# Patient Record
Sex: Female | Born: 1985 | Race: White | Hispanic: No | Marital: Married | Smoking: Former smoker
Health system: Southern US, Community
[De-identification: ages and names within clinical notes are randomized; demographics above are authoritative.]

## PROBLEM LIST (undated history)

## (undated) DIAGNOSIS — F419 Anxiety disorder, unspecified: Secondary | ICD-10-CM

## (undated) DIAGNOSIS — E282 Polycystic ovarian syndrome: Secondary | ICD-10-CM

## (undated) DIAGNOSIS — E039 Hypothyroidism, unspecified: Secondary | ICD-10-CM

## (undated) HISTORY — PX: WISDOM TOOTH EXTRACTION: SHX21

## (undated) HISTORY — PX: LASER ABLATION OF THE CERVIX: SHX1949

---

## 2015-10-03 NOTE — L&D Delivery Note (Signed)
Delivery Note Pt progressed rapidly to complete dilation and began  to have variable decels again so was instructed on pushing and did great pushing less than 30 minutes.   At 2:26 PM a healthy female was delivered via Vaginal, Spontaneous Delivery (Presentation: LOA  ).  APGAR: 9, 9; weight pending.   Placenta status: delivered spontaneously .  Cord:  with the following complications:tight nuchal x 2 delivered through.   Anesthesia:  epidural Episiotomy:  none Lacerations:  Abrasions repaired for hemostasis Suture Repair: 3.0 vicryl rapide Est. Blood Loss (mL): 125ml  Mom to postpartum.  Baby to Couplet care / Skin to Skin.  Will monitor pt BP carefully given mild preeclampsia.   D/w pt circumcision in detail and she desires in hospital and has paid at office.  Oliver PilaRICHARDSON,Arling Cerone W 08/21/2016, 2:48 PM

## 2016-02-02 LAB — OB RESULTS CONSOLE ABO/RH: RH TYPE: POSITIVE

## 2016-02-02 LAB — OB RESULTS CONSOLE GC/CHLAMYDIA
Chlamydia: NEGATIVE
Gonorrhea: NEGATIVE

## 2016-02-02 LAB — OB RESULTS CONSOLE RPR: RPR: NONREACTIVE

## 2016-02-02 LAB — OB RESULTS CONSOLE HEPATITIS B SURFACE ANTIGEN: HEP B S AG: NEGATIVE

## 2016-02-02 LAB — OB RESULTS CONSOLE RUBELLA ANTIBODY, IGM: RUBELLA: IMMUNE

## 2016-02-02 LAB — OB RESULTS CONSOLE GBS
GBS: NEGATIVE
GBS: POSITIVE
GBS: POSITIVE

## 2016-02-02 LAB — OB RESULTS CONSOLE ANTIBODY SCREEN: ANTIBODY SCREEN: NEGATIVE

## 2016-02-02 LAB — OB RESULTS CONSOLE HIV ANTIBODY (ROUTINE TESTING): HIV: NONREACTIVE

## 2016-07-28 LAB — OB RESULTS CONSOLE GBS: GBS: POSITIVE

## 2016-08-21 ENCOUNTER — Inpatient Hospital Stay (HOSPITAL_COMMUNITY): Payer: BLUE CROSS/BLUE SHIELD | Admitting: Anesthesiology

## 2016-08-21 ENCOUNTER — Inpatient Hospital Stay (HOSPITAL_COMMUNITY)
Admission: AD | Admit: 2016-08-21 | Discharge: 2016-08-23 | DRG: 775 | Disposition: A | Discharge status: Home / Self Care | Payer: BLUE CROSS/BLUE SHIELD | Source: Ambulatory Visit | Attending: Obstetrics and Gynecology | Admitting: Obstetrics and Gynecology

## 2016-08-21 ENCOUNTER — Encounter (HOSPITAL_COMMUNITY): Payer: Self-pay | Admitting: *Deleted

## 2016-08-21 DIAGNOSIS — O139 Gestational [pregnancy-induced] hypertension without significant proteinuria, unspecified trimester: Secondary | ICD-10-CM | POA: Diagnosis present

## 2016-08-21 DIAGNOSIS — O99214 Obesity complicating childbirth: Secondary | ICD-10-CM | POA: Diagnosis present

## 2016-08-21 DIAGNOSIS — O1403 Mild to moderate pre-eclampsia, third trimester: Secondary | ICD-10-CM

## 2016-08-21 DIAGNOSIS — Z3A38 38 weeks gestation of pregnancy: Secondary | ICD-10-CM

## 2016-08-21 DIAGNOSIS — O99284 Endocrine, nutritional and metabolic diseases complicating childbirth: Secondary | ICD-10-CM | POA: Diagnosis present

## 2016-08-21 DIAGNOSIS — O99824 Streptococcus B carrier state complicating childbirth: Secondary | ICD-10-CM | POA: Diagnosis present

## 2016-08-21 DIAGNOSIS — G932 Benign intracranial hypertension: Secondary | ICD-10-CM | POA: Diagnosis present

## 2016-08-21 DIAGNOSIS — Z6841 Body Mass Index (BMI) 40.0 and over, adult: Secondary | ICD-10-CM

## 2016-08-21 DIAGNOSIS — R030 Elevated blood-pressure reading, without diagnosis of hypertension: Secondary | ICD-10-CM | POA: Diagnosis present

## 2016-08-21 DIAGNOSIS — E039 Hypothyroidism, unspecified: Secondary | ICD-10-CM | POA: Diagnosis present

## 2016-08-21 DIAGNOSIS — O1404 Mild to moderate pre-eclampsia, complicating childbirth: Secondary | ICD-10-CM | POA: Diagnosis present

## 2016-08-21 HISTORY — DX: Hypothyroidism, unspecified: E03.9

## 2016-08-21 LAB — COMPREHENSIVE METABOLIC PANEL
ALT: 18 U/L (ref 14–54)
AST: 18 U/L (ref 15–41)
Albumin: 2.9 g/dL — ABNORMAL LOW (ref 3.5–5.0)
Alkaline Phosphatase: 144 U/L — ABNORMAL HIGH (ref 38–126)
Anion gap: 8 (ref 5–15)
BUN: 7 mg/dL (ref 6–20)
CHLORIDE: 106 mmol/L (ref 101–111)
CO2: 21 mmol/L — AB (ref 22–32)
CREATININE: 0.63 mg/dL (ref 0.44–1.00)
Calcium: 9.4 mg/dL (ref 8.9–10.3)
GFR calc Af Amer: >60 mL/min (ref >60)
GFR calc non Af Amer: >60 mL/min (ref >60)
Glucose, Bld: 101 mg/dL — ABNORMAL HIGH (ref 65–99)
Potassium: 4.4 mmol/L (ref 3.5–5.1)
Sodium: 135 mmol/L (ref 135–145)
Total Bilirubin: 0.4 mg/dL (ref 0.3–1.2)
Total Protein: 6.6 g/dL (ref 6.5–8.1)

## 2016-08-21 LAB — CBC
HEMATOCRIT: 37.3 % (ref 36.0–46.0)
Hemoglobin: 12.7 g/dL (ref 12.0–15.0)
MCH: 30.2 pg (ref 26.0–34.0)
MCHC: 34 g/dL (ref 30.0–36.0)
MCV: 88.8 fL (ref 78.0–100.0)
Platelets: 430 10*3/uL — ABNORMAL HIGH (ref 150–400)
RBC: 4.2 MIL/uL (ref 3.87–5.11)
RDW: 14.6 % (ref 11.5–15.5)
WBC: 14 10*3/uL — AB (ref 4.0–10.5)

## 2016-08-21 LAB — TYPE AND SCREEN
ABO/RH(D): AB POS
Antibody Screen: NEGATIVE

## 2016-08-21 LAB — ABO/RH: ABO/RH(D): AB POS

## 2016-08-21 LAB — PROTEIN / CREATININE RATIO, URINE
Creatinine, Urine: 62 mg/dL
Protein Creatinine Ratio: 0.92 mg/mg{Cre} — ABNORMAL HIGH (ref 0.00–0.15)
Total Protein, Urine: 57 mg/dL

## 2016-08-21 MED ORDER — OXYTOCIN BOLUS FROM INFUSION
500.0000 mL | Freq: Once | INTRAVENOUS | Status: AC
  Administered 2016-08-21: 500 mL via INTRAVENOUS

## 2016-08-21 MED ORDER — FENTANYL 2.5 MCG/ML BUPIVACAINE 1/10 % EPIDURAL INFUSION (WH - ANES)
14.0000 mL/h | INTRAMUSCULAR | Status: DC | PRN
  Administered 2016-08-21 (×2): 14 mL/h via EPIDURAL

## 2016-08-21 MED ORDER — TERBUTALINE SULFATE 1 MG/ML IJ SOLN
0.2500 mg | Freq: Once | INTRAMUSCULAR | Status: DC | PRN
  Filled 2016-08-21: qty 1

## 2016-08-21 MED ORDER — OXYCODONE HCL 5 MG PO TABS: 5.0000 mg | ORAL_TABLET | ORAL | Status: DC | PRN

## 2016-08-21 MED ORDER — PRENATAL MULTIVITAMIN CH
1.0000 | ORAL_TABLET | Freq: Every day | ORAL | Status: DC
  Administered 2016-08-22 – 2016-08-23 (×2): 1 via ORAL
  Filled 2016-08-21 (×2): qty 1

## 2016-08-21 MED ORDER — SOD CITRATE-CITRIC ACID 500-334 MG/5ML PO SOLN: 30.0000 mL | ORAL | Status: DC | PRN

## 2016-08-21 MED ORDER — TETANUS-DIPHTH-ACELL PERTUSSIS 5-2.5-18.5 LF-MCG/0.5 IM SUSP: 0.5000 mL | Freq: Once | INTRAMUSCULAR | Status: DC

## 2016-08-21 MED ORDER — ONDANSETRON HCL 4 MG PO TABS: 4.0000 mg | ORAL_TABLET | ORAL | Status: DC | PRN

## 2016-08-21 MED ORDER — ONDANSETRON HCL 4 MG/2ML IJ SOLN: 4.0000 mg | INTRAMUSCULAR | Status: DC | PRN

## 2016-08-21 MED ORDER — OXYTOCIN 40 UNITS IN LACTATED RINGERS INFUSION - SIMPLE MED
1.0000 m[IU]/min | INTRAVENOUS | Status: DC
  Administered 2016-08-21: 2 m[IU]/min via INTRAVENOUS
  Filled 2016-08-21: qty 1000

## 2016-08-21 MED ORDER — LIDOCAINE HCL (PF) 1 % IJ SOLN
30.0000 mL | INTRAMUSCULAR | Status: DC | PRN
  Filled 2016-08-21: qty 30

## 2016-08-21 MED ORDER — SIMETHICONE 80 MG PO CHEW: 80.0000 mg | CHEWABLE_TABLET | ORAL | Status: DC | PRN

## 2016-08-21 MED ORDER — SENNOSIDES-DOCUSATE SODIUM 8.6-50 MG PO TABS
2.0000 | ORAL_TABLET | ORAL | Status: DC
  Administered 2016-08-22 – 2016-08-23 (×2): 2 via ORAL
  Filled 2016-08-21 (×3): qty 2

## 2016-08-21 MED ORDER — PENICILLIN G POTASSIUM 5000000 UNITS IJ SOLR
5.0000 10*6.[IU] | Freq: Once | INTRAVENOUS | Status: AC
  Administered 2016-08-21: 5 10*6.[IU] via INTRAVENOUS
  Filled 2016-08-21: qty 5

## 2016-08-21 MED ORDER — COCONUT OIL OIL
1.0000 "application " | TOPICAL_OIL | Status: DC | PRN
  Administered 2016-08-22: 1 via TOPICAL
  Filled 2016-08-21: qty 120

## 2016-08-21 MED ORDER — DIBUCAINE 1 % RE OINT: 1.0000 "application " | TOPICAL_OINTMENT | RECTAL | Status: DC | PRN

## 2016-08-21 MED ORDER — LIDOCAINE HCL (PF) 1 % IJ SOLN
INTRAMUSCULAR | Status: DC | PRN
  Administered 2016-08-21 (×2): 7 mL via EPIDURAL

## 2016-08-21 MED ORDER — OXYCODONE-ACETAMINOPHEN 5-325 MG PO TABS: 2.0000 | ORAL_TABLET | ORAL | Status: DC | PRN

## 2016-08-21 MED ORDER — PHENYLEPHRINE 40 MCG/ML (10ML) SYRINGE FOR IV PUSH (FOR BLOOD PRESSURE SUPPORT)
80.0000 ug | PREFILLED_SYRINGE | INTRAVENOUS | Status: DC | PRN
  Filled 2016-08-21: qty 5

## 2016-08-21 MED ORDER — LACTATED RINGERS IV SOLN
INTRAVENOUS | Status: DC
  Administered 2016-08-21: 125 mL via INTRAVENOUS

## 2016-08-21 MED ORDER — FLEET ENEMA 7-19 GM/118ML RE ENEM: 1.0000 | ENEMA | RECTAL | Status: DC | PRN

## 2016-08-21 MED ORDER — WITCH HAZEL-GLYCERIN EX PADS: 1.0000 "application " | MEDICATED_PAD | CUTANEOUS | Status: DC | PRN

## 2016-08-21 MED ORDER — OXYTOCIN 40 UNITS IN LACTATED RINGERS INFUSION - SIMPLE MED: 2.5000 [IU]/h | INTRAVENOUS | Status: DC

## 2016-08-21 MED ORDER — ZOLPIDEM TARTRATE 5 MG PO TABS: 5.0000 mg | ORAL_TABLET | Freq: Every evening | ORAL | Status: DC | PRN

## 2016-08-21 MED ORDER — ACETAMINOPHEN 325 MG PO TABS: 650.0000 mg | ORAL_TABLET | ORAL | Status: DC | PRN

## 2016-08-21 MED ORDER — PENICILLIN G POT IN DEXTROSE 60000 UNIT/ML IV SOLN
3.0000 10*6.[IU] | INTRAVENOUS | Status: DC
  Filled 2016-08-21 (×3): qty 50

## 2016-08-21 MED ORDER — PHENYLEPHRINE 40 MCG/ML (10ML) SYRINGE FOR IV PUSH (FOR BLOOD PRESSURE SUPPORT)
PREFILLED_SYRINGE | INTRAVENOUS | Status: AC
  Filled 2016-08-21: qty 20

## 2016-08-21 MED ORDER — ACETAMINOPHEN 325 MG PO TABS
650.0000 mg | ORAL_TABLET | ORAL | Status: DC | PRN
  Administered 2016-08-21 – 2016-08-23 (×7): 650 mg via ORAL
  Filled 2016-08-21 (×7): qty 2

## 2016-08-21 MED ORDER — LACTATED RINGERS IV SOLN: 500.0000 mL | Freq: Once | INTRAVENOUS | Status: DC

## 2016-08-21 MED ORDER — BENZOCAINE-MENTHOL 20-0.5 % EX AERO
1.0000 "application " | INHALATION_SPRAY | CUTANEOUS | Status: DC | PRN
  Administered 2016-08-21: 1 via TOPICAL
  Filled 2016-08-21: qty 56

## 2016-08-21 MED ORDER — EPHEDRINE 5 MG/ML INJ
10.0000 mg | INTRAVENOUS | Status: DC | PRN
  Filled 2016-08-21: qty 4

## 2016-08-21 MED ORDER — ONDANSETRON HCL 4 MG/2ML IJ SOLN: 4.0000 mg | Freq: Four times a day (QID) | INTRAMUSCULAR | Status: DC | PRN

## 2016-08-21 MED ORDER — BUTORPHANOL TARTRATE 1 MG/ML IJ SOLN: 1.0000 mg | INTRAMUSCULAR | Status: DC | PRN

## 2016-08-21 MED ORDER — OXYCODONE HCL 5 MG PO TABS: 10.0000 mg | ORAL_TABLET | ORAL | Status: DC | PRN

## 2016-08-21 MED ORDER — OXYCODONE-ACETAMINOPHEN 5-325 MG PO TABS: 1.0000 | ORAL_TABLET | ORAL | Status: DC | PRN

## 2016-08-21 MED ORDER — LACTATED RINGERS IV SOLN: 500.0000 mL | INTRAVENOUS | Status: DC | PRN

## 2016-08-21 MED ORDER — LACTATED RINGERS IV SOLN
INTRAVENOUS | Status: DC
  Administered 2016-08-21: 12:00:00 via INTRAUTERINE

## 2016-08-21 MED ORDER — FENTANYL 2.5 MCG/ML BUPIVACAINE 1/10 % EPIDURAL INFUSION (WH - ANES)
INTRAMUSCULAR | Status: AC
  Filled 2016-08-21: qty 100

## 2016-08-21 MED ORDER — DIPHENHYDRAMINE HCL 50 MG/ML IJ SOLN: 12.5000 mg | INTRAMUSCULAR | Status: DC | PRN

## 2016-08-21 MED ORDER — DIPHENHYDRAMINE HCL 25 MG PO CAPS: 25.0000 mg | ORAL_CAPSULE | Freq: Four times a day (QID) | ORAL | Status: DC | PRN

## 2016-08-21 NOTE — Progress Notes (Signed)
Patient ID: Victoria French, female   DOB: 01-Jun-1986, 30 y.o.   MRN: [id number] Pt feeling increasing contractions afeb BP 130/90's  FHR noted to have decreased variability and some questionable decels and PCN on board for +GBS.  AROM performed and FSE applied with recurrent variable decels noted.  IUPC placed and amnioinfusion begun with improvement of variables.   Bloodwork WNL but prot:creat ratio .92 c/w mild preeclampsia.  Will follow BP and if gets in severe range consider magnesium.  Pt now requesting epidural and allow

## 2016-08-21 NOTE — Anesthesia Preprocedure Evaluation (Signed)
Anesthesia Evaluation  Patient identified by MRN, date of birth, ID band Patient awake    Reviewed: Allergy & Precautions, H&P , NPO status , Patient's Chart, lab work & pertinent test results  Airway Mallampati: II  TM Distance: >3 FB Neck ROM: full    Dental no notable dental hx.    Pulmonary neg pulmonary ROS,    Pulmonary exam normal        Cardiovascular Normal cardiovascular exam     Neuro/Psych negative neurological ROS  negative psych ROS   GI/Hepatic negative GI ROS, Neg liver ROS,   Endo/Other  Morbid obesity  Renal/GU negative Renal ROS     Musculoskeletal   Abdominal (+) + obese,   Peds  Hematology negative hematology ROS (+)   Anesthesia Other Findings   Reproductive/Obstetrics (+) Pregnancy                             Anesthesia Physical Anesthesia Plan  ASA: III  Anesthesia Plan: Epidural   Post-op Pain Management:    Induction:   Airway Management Planned:   Additional Equipment:   Intra-op Plan:   Post-operative Plan:   Informed Consent: I have reviewed the patients History and Physical, chart, labs and discussed the procedure including the risks, benefits and alternatives for the proposed anesthesia with the patient or authorized representative who has indicated his/her understanding and acceptance.     Plan Discussed with:   Anesthesia Plan Comments:         Anesthesia Quick Evaluation

## 2016-08-21 NOTE — H&P (Signed)
Victoria French is a 30 y.o. female G1P0 at 7438 2/7 weeks (EDD 09/02/16 by presenting for latent phase labor with cervical change to 4cm on exam in office.  BP there also noted to be elevated 158/110, and mild proteinuria on dipstick.  Pt denies current PIH sx, states had some mild HA earlier that resolved.   Prenatal care complicated by hypothyroidism, stable on meds.  She has pseudotumor cerebri as well but has been stable off meds. GBS positive and rubella non-immune. OB History    Gravida Para Term Preterm AB Living   1 0 0 0 0 0   SAB TAB Ectopic Multiple Live Births   0 0 0 0 0     Past Medical History:  Diagnosis Date  . Hypothyroidism    History reviewed. No pertinent surgical history. Family History: family history is not on file. Social History:  reports that she has never smoked. She has never used smokeless tobacco. She reports that she does not drink alcohol or use drugs.     Maternal Diabetes: No Genetic Screening: Normal Maternal Ultrasounds/Referrals: Normal Fetal Ultrasounds or other Referrals:  None Maternal Substance Abuse:  No Significant Maternal Medications:  Meds include: Syntroid Significant Maternal Lab Results:  Lab values include: Group B Strep positive Other Comments:  None  Review of Systems  Gastrointestinal: Positive for abdominal pain.  Neurological: Negative for headaches.   Maternal Medical History:  Reason for admission: Contractions.   Contractions: Onset was 6-12 hours ago.   Frequency: irregular.   Perceived severity is moderate.    Fetal activity: Perceived fetal activity is normal.    Prenatal complications: PIH.   Prenatal Complications - Diabetes: none.    Dilation: 4 Effacement (%): 100 Station: -1 Exam by:: Rayshun Kandler Blood pressure (!) 143/93, pulse 91, temperature 97.5 F (36.4 C), temperature source Oral, resp. rate 20, height 5\' 2"  (1.575 m), weight 98.9 kg (218 lb). Maternal Exam:  Uterine Assessment: Contraction  strength is moderate.  Contraction frequency is irregular.   Abdomen: Patient reports no abdominal tenderness. Fetal presentation: vertex  Introitus: Normal vulva. Normal vagina.    Physical Exam  Constitutional: She appears well-developed and well-nourished.  Cardiovascular: Normal rate and regular rhythm.   Respiratory: Effort normal.  GI: Soft.  Genitourinary: Vagina normal.  Musculoskeletal: She exhibits edema.  Neurological: She is alert.  Psychiatric: She has a normal mood and affect.    Prenatal labs: ABO, Rh: AB/Positive/-- (05/03 0000) Antibody: Negative (05/03 0000) Rubella: Immune (05/03 0000) RPR: Nonreactive (05/03 0000)  HBsAg: Negative (05/03 0000)  HIV: Non-reactive (05/03 0000)  GBS: Positive (10/27 0000)  Hgb AA Essential panel normal One hour GCT 128  Assessment/Plan: Pt admitted in latent labor with elevated BP.  Evaluating for preeclampsia vs gestational hypertension.  D/w her plan and she is agreeable to actively managing labor with AROM after PCN and pitocin. Will decide about epidural.    Huel CoteICHARDSON,Deshane Cotroneo W 08/21/2016, 12:21 PM

## 2016-08-21 NOTE — Anesthesia Procedure Notes (Signed)
Epidural Patient location during procedure: OB Start time: 08/21/2016 12:52 PM End time: 08/21/2016 12:56 PM  Staffing Anesthesiologist: Leilani AbleHATCHETT, Nancee Brownrigg Performed: anesthesiologist   Preanesthetic Checklist Completed: patient identified, surgical consent, pre-op evaluation, timeout performed, IV checked, risks and benefits discussed and monitors and equipment checked  Epidural Patient position: sitting Prep: site prepped and draped and DuraPrep Patient monitoring: continuous pulse ox and blood pressure Approach: midline Location: L3-L4 Injection technique: LOR air  Needle:  Needle type: Tuohy  Needle gauge: 17 G Needle length: 9 cm and 9 Needle insertion depth: 7 cm Catheter type: closed end flexible Catheter size: 19 Gauge Catheter at skin depth: 12 cm Test dose: negative and Other  Assessment Sensory level: T9 Events: blood not aspirated, injection not painful, no injection resistance, negative IV test and no paresthesia  Additional Notes Reason for block:procedure for pain

## 2016-08-21 NOTE — Anesthesia Postprocedure Evaluation (Signed)
Anesthesia Post Note  Patient: Victoria French  Procedure(s) Performed: * No procedures listed *  Patient location during evaluation: Mother Baby Anesthesia Type: Epidural Level of consciousness: awake and alert Pain management: satisfactory to patient Vital Signs Assessment: post-procedure vital signs reviewed and stable Respiratory status: respiratory function stable Cardiovascular status: stable Postop Assessment: no headache, no backache, epidural receding, patient able to bend at knees, no signs of nausea or vomiting and adequate PO intake Anesthetic complications: no     Last Vitals:  Vitals:   08/21/16 1936 08/21/16 2050  BP: (!) 133/94 (!) 142/85  Pulse: 97 92  Resp: 18 18  Temp: 36.6 C 36.8 C    Last Pain:  Vitals:   08/21/16 2050  TempSrc: Oral  PainSc:    Pain Goal:                 Salil Raineri

## 2016-08-21 NOTE — Progress Notes (Signed)
Dr Senaida Oresichardson notified of pt's b/p.  Pt having no blurred vision,h/a,or epigastric pain. No orders received

## 2016-08-22 LAB — CBC
HCT: 34.7 % — ABNORMAL LOW (ref 36.0–46.0)
Hemoglobin: 11.8 g/dL — ABNORMAL LOW (ref 12.0–15.0)
MCH: 30.3 pg (ref 26.0–34.0)
MCHC: 34 g/dL (ref 30.0–36.0)
MCV: 89.2 fL (ref 78.0–100.0)
PLATELETS: 348 10*3/uL (ref 150–400)
RBC: 3.89 MIL/uL (ref 3.87–5.11)
RDW: 14.7 % (ref 11.5–15.5)
WBC: 14.2 10*3/uL — AB (ref 4.0–10.5)

## 2016-08-22 LAB — RPR: RPR Ser Ql: NONREACTIVE

## 2016-08-22 NOTE — Progress Notes (Signed)
Post Partum Day 1 Subjective: no complaints, up ad lib, tolerating PO and nl lochia, pain controlled  Objective: Blood pressure (!) 142/77, pulse 94, temperature 98.1 F (36.7 C), temperature source Oral, resp. rate 18, height 5\' 2"  (1.575 m), weight 98.9 kg (218 lb), SpO2 99 %, unknown if currently breastfeeding.  Physical Exam:  General: alert and no distress Lochia: appropriate Uterine Fundus: firm   Recent Labs  08/21/16 1020 08/22/16 0544  HGB 12.7 11.8*  HCT 37.3 34.7*    Assessment/Plan: Plan for discharge tomorrow, Breastfeeding and Lactation consult.  Routine care.     LOS: 1 day   Bovard-Stuckert, Taaliyah Delpriore 08/22/2016, 8:00 AM

## 2016-08-22 NOTE — Lactation Note (Signed)
This note was copied from a baby's chart. Lactation Consultation Note  Patient Name: Victoria French WUJWJ'XToday's Date: 08/22/2016 Reason for consult: Initial assessment Breastfeeding consultation services and support information given and reviewed.  Mom states baby has been latching and nursing well but c/o nipple soreness.  Nipples erect and red.  Small bruise noted on right side.  Basic teaching done. Mom has been using cradle hold.  Assisted with positioning baby in cross cradle hold.  FOB shown how to help compress breast for deeper latch.  Baby opened wide and latched easily and deep.  Parents shown how to give a gently chin tug to bring bottom lip out.  Observed baby actively nursing with swallows for 10 minutes.  Baby still nursing when I left the room. Mom doing good breast massage during feeding.  Encouraged to call for assist/concerns.  Maternal Data Has patient been taught Hand Expression?: Yes Does the patient have breastfeeding experience prior to this delivery?: No  Feeding Feeding Type: Breast Fed Length of feed: 10 min  LATCH Score/Interventions Latch: Grasps breast easily, tongue down, lips flanged, rhythmical sucking. Intervention(s): Adjust position;Assist with latch;Breast massage;Breast compression  Audible Swallowing: A few with stimulation Intervention(s): Skin to skin;Hand expression;Alternate breast massage  Type of Nipple: Everted at rest and after stimulation  Comfort (Breast/Nipple): Filling, red/small blisters or bruises, mild/mod discomfort  Problem noted: Mild/Moderate discomfort;Cracked, bleeding, blisters, bruises  Hold (Positioning): Assistance needed to correctly position infant at breast and maintain latch.  LATCH Score: 7  Lactation Tools Discussed/Used     Consult Status Consult Status: Follow-up Date: 08/23/16 Follow-up type: In-patient    Huston FoleyMOULDEN, Jalani Cullifer S 08/22/2016, 11:57 AM

## 2016-08-23 MED ORDER — IBUPROFEN 600 MG PO TABS: 600.0000 mg | ORAL_TABLET | Freq: Four times a day (QID) | ORAL | 0 refills | Status: DC | PRN

## 2016-08-23 NOTE — Progress Notes (Signed)
PPD #2 Feeling ok, working on breastfeeding Afeb, VSS, most BP normal Fundus firm D/c home

## 2016-08-23 NOTE — Discharge Summary (Signed)
OB Discharge Summary     Patient Name: Victoria French DOB: 02-Apr-1986 MRN: [id number]  Date of admission: 08/21/2016 Delivering MD: Huel CoteICHARDSON, KATHY   Date of discharge: 08/23/2016  Admitting diagnosis: LABOR Intrauterine pregnancy: 424w2d     Secondary diagnosis:  Active Problems:   PIH (pregnancy induced hypertension)   Mild preeclampsia, third trimester   NSVD (normal spontaneous vaginal delivery)      Discharge diagnosis: Term Pregnancy Delivered and Gestational Hypertension      Hospital course:  Onset of Labor With Vaginal Delivery     30 y.o. yo G1P1001 at 724w2d was admitted in Latent Labor on 08/21/2016. Patient had an uncomplicated labor course as follows:  Membrane Rupture Time/Date: 11:42 AM ,08/21/2016   Intrapartum Procedures: Episiotomy: None [1]                                         Lacerations:     Patient had a delivery of a Viable infant. 08/21/2016  Information for the patient's newborn:  Bridgette HabermannMoreira, Boy Helane [[id number]][[id number]]  Delivery Method: Vaginal, Spontaneous Delivery (Filed from Delivery Summary)    Pateint had an uncomplicated postpartum course.  She is ambulating, tolerating a regular diet, passing flatus, and urinating well. Patient is discharged home in stable condition on 08/23/16.    Physical exam Vitals:   08/22/16 0510 08/22/16 0915 08/22/16 1904 08/23/16 0555  BP: (!) 142/77 125/82 140/85 133/86  Pulse: 94 99 97 92  Resp: 18 18 18 20   Temp: 98.1 F (36.7 C) 97.7 F (36.5 C)  97.7 F (36.5 C)  TempSrc: Oral Oral  Oral  SpO2:  99%    Weight:      Height:       General: alert Lochia: appropriate Uterine Fundus: firm  Labs: Lab Results  Component Value Date   WBC 14.2 (H) 08/22/2016   HGB 11.8 (L) 08/22/2016   HCT 34.7 (L) 08/22/2016   MCV 89.2 08/22/2016   PLT 348 08/22/2016   CMP Latest Ref Rng & Units 08/21/2016  Glucose 65 - 99 mg/dL 782(N101(H)  BUN 6 - 20 mg/dL 7  Creatinine 5.620.44 - 1.301.00 mg/dL 8.650.63  Sodium 784135 - 696145 mmol/L  135  Potassium 3.5 - 5.1 mmol/L 4.4  Chloride 101 - 111 mmol/L 106  CO2 22 - 32 mmol/L 21(L)  Calcium 8.9 - 10.3 mg/dL 9.4  Total Protein 6.5 - 8.1 g/dL 6.6  Total Bilirubin 0.3 - 1.2 mg/dL 0.4  Alkaline Phos 38 - 126 U/L 144(H)  AST 15 - 41 U/L 18  ALT 14 - 54 U/L 18    Discharge instruction: per After Visit Summary and "Baby and Me Booklet".  After visit meds:    Medication List    STOP taking these medications   metFORMIN 500 MG tablet Commonly known as:  GLUCOPHAGE     TAKE these medications   calcium carbonate 500 MG chewable tablet Commonly known as:  TUMS - dosed in mg elemental calcium Chew 2 tablets by mouth 3 (three) times daily as needed for indigestion or heartburn.   ibuprofen 600 MG tablet Commonly known as:  MOTRIN IB Take 1 tablet (600 mg total) by mouth every 6 (six) hours as needed for moderate pain.   levothyroxine 50 MCG tablet Commonly known as:  SYNTHROID, LEVOTHROID Take 50 mcg by mouth daily.   prenatal multivitamin Tabs tablet Take 1 tablet  by mouth daily at 12 noon.       Diet: routine diet  Activity: Advance as tolerated. Pelvic rest for 6 weeks.   Outpatient follow up:one week for BP check   Newborn Data: Live born female  Birth Weight: 6 lb 4.9 oz (2860 g) APGAR: 9, 9  Baby Feeding: Breast Disposition:home with mother   08/23/2016 Zenaida NieceMEISINGER,Khris Jansson D, MD

## 2016-08-23 NOTE — Lactation Note (Signed)
This note was copied from a baby's chart. Lactation Consultation Note  Patient Name: Boy Shearon Baloshley Muriel ZOXWR'UToday's Date: 08/23/2016 Reason for consult: Follow-up assessment;Infant < 6lbs Mom had just latched baby to 2nd breast prior to my arrival. Baby appears to have deep latch, demonstrates few good suckling bursts with swallowing motions noted. Mom started supplementing last night due to baby cluster feeding. Baby has been to breast 13 times in past 24 hours 20-30 minutes on average. Mom reports baby not satisfied last night after BF. Supplemented 3 times 20-32 ml. Good output. Advised Mom baby should be at breast 8-12 times in 24 hours and with feeding ques. Advised to keep baby active at breast for 15-20 minutes both breasts most feeding. Always BF before giving any bottles. Advised Mom to post pump 4 times/day to encourage milk production and to give baby back any amount of EBM she receives. Continue to supplement after BF if baby not satisfied at breast or not waking to BF. Monitor voids/stools. Engorgement care reviewed if needed. Advised of OP services. Mom to call for questions/concerns.   Maternal Data    Feeding Feeding Type: Breast Fed Length of feed: 8 min  LATCH Score/Interventions Latch: Grasps breast easily, tongue down, lips flanged, rhythmical sucking. Intervention(s): Breast massage  Audible Swallowing: A few with stimulation  Type of Nipple: Everted at rest and after stimulation (short nipple shafts)  Comfort (Breast/Nipple): Soft / non-tender     Hold (Positioning): No assistance needed to correctly position infant at breast. Intervention(s): Breastfeeding basics reviewed;Support Pillows;Position options;Skin to skin  LATCH Score: 9  Lactation Tools Discussed/Used     Consult Status Consult Status: Complete Date: 08/23/16 Follow-up type: In-patient    Alfred LevinsGranger, Naba Sneed Ann 08/23/2016, 10:55 AM

## 2016-08-23 NOTE — Discharge Instructions (Signed)
As per discharge pamphlet °

## 2016-08-24 ENCOUNTER — Ambulatory Visit: Payer: Self-pay

## 2016-08-24 NOTE — Lactation Note (Signed)
This note was copied from a baby's chart. Lactation Consultation Note  Patient Name: Victoria French ZOXWR'UToday's Date: 08/24/2016   Visited with Mom on day of baby's discharge, baby 4166 hrs old.  Baby receiving formula supplementation by bottle per Mom's choice due to baby cluster feeding and not seeming to be satisfied.  Mom aware of importance of pumping both breasts whenever baby is supplemented to support her milk supply.  Mom renting for 2 weeks a Symphony DEBP and will check with her insurance company about providing her with a breast pump.  Mom resting her left nipple and using EBM and coconut oil on it due to soreness.  Baby last fed comfortably on her right breast, for over 20 minutes with swallows noted and heard.  Baby contented acting and laying STS on Mom's chest.  Reviewed volume parameters for supplementation following breast feeding, and also volume to be used if not BFing.  Baby to take 18-25 ml today following BFing.  Encouraged breast massage, and hand expression as well as double pumping.  Goal is 8-12 Bfings in 24 hrs.   Pediatrician appointment 08/28/16.    Lactation Outpatient appointment for 08/25/16 @ 9am.    To call prn for assistance as needed.      Judee ClaraSmith, Rodrickus Min E 08/24/2016, 9:09 AM

## 2016-08-25 ENCOUNTER — Ambulatory Visit (HOSPITAL_COMMUNITY)
Admission: RE | Admit: 2016-08-25 | Discharge: 2016-08-25 | Disposition: A | Discharge status: Home / Self Care | Payer: BLUE CROSS/BLUE SHIELD | Source: Ambulatory Visit | Attending: Obstetrics and Gynecology | Admitting: Obstetrics and Gynecology

## 2016-08-25 NOTE — Lactation Note (Signed)
Lactation Consult: weight today   2746 g  6 # 0.8 oz  Mother's reason for visit:  Mom DC'd yesterday. Can not see Ped until Monday. Weight check and feeding assessment Visit Type:  Feeding assessment  Consult:  Initial Lactation Consultant:  Audry RilesWeeks, Claudette Wermuth D  ________________________________________________________________________  Baby's Name:  Victoria French Date of Birth:  08/21/2014 [place]  female Gestational Age: 2838w2d (At Birth) Birth Weight:  6 lb 4.9 oz (2860 g) Weight at Discharge:  Weight: 6 lb 0.8 oz (2745 g)               Date of Discharge:  08/24/2016      [facility]   08/21/16 2237 08/23/16 0020 08/24/16 0027  Weight: 6 lb 2.8 oz (2800 g) 5 lb 14.7 oz (2685 g) 6 lb 0.8 oz (2745 g)     ________________________________________________________________________  Mother's Name: Shearon BaloAshley Garrison Type of delivery:  vag Breastfeeding Experience:  P1 ________________________________________________________________________  Breastfeeding History (Post Discharge)  Frequency of breastfeeding:  q 2-3 hours Duration of feeding:  30-40 min  Supplementation  Formula:  Volume 30 ml had not given formula since last night  Breastmilk:as available   Method:  Bottle,   Pumping  Type of pump:  Symphony Frequency:  Has not pumped through the night Volume:  5-10 ml    Infant Intake and Output Assessment  Voids:  4+ in 24 hrs.  Color:  Clear yellow Stools:  3 in 24 hrs.  Color:  Brown and Yellow  ________________________________________________________________________  Maternal Breast Assessment  Breast:  Filling Nipple:  Erect and healing, some bruisong noted on right areola  _______________________________________________________________________ Feeding Assessment/Evaluation  Initial feeding assessment:   Positioning:  Cross cradle Left breast  LATCH documentation:  Latch:  2 = Grasps breast easily, tongue down, lips  flanged, rhythmical sucking.  Audible swallowing:  1 = A few with stimulation  Type of nipple:  2 = Everted at rest and after stimulation  Comfort (Breast/Nipple):  1 = Filling, red/small blisters or bruises, mild/mod discomfort  Hold (Positioning):  1 = Assistance needed to correctly position infant at breast and maintain latch  LATCH score:  7  Attached assessment:  Deep  Lips flanged:  Yes.    Lips untucked:  No.  Suck assessment:  Nutritive and Nonnutritive  Pre-feed weight:  2746 g  6# 0.8 oz Post-feed weight:  2754 g   6 b# 1.1 oz Amount transferred:  8 ml  Benson latched well but few swallows noted. Mom reports she fed him some just before leaving to come for appointment. Some non nutritive sucking noted by mom. She reports her breasts are feeling fuller this morning and she states breast feels softer after he nursed  Pre-feed weight:  2728  g 6 # 0.2 oz after diaper change Post-feed weight:  2730 g  6 # 0.3 oz Amount transferred:  2 ml Amount supplemented:   43ml  Assisted mom with football hold on the right breast. Mom reports this feels comfortable. Suggested changing positions to help nipples to heal. Again mostly non nutritive and mom states she can feel the difference between deep sucks with swallows vs shallow sucks. Supplemented with formula by bottle after nursing. Teena DunkBenson took a few minutes to get a good seal on bottle nipple. Mom has been using pacifier- suggested not using one until he gets better at breast feeding.   Plan:  Breast feed first, watch for deep sucks and swallows, breast  should soften after nursing.  Supplement after nursing if he still acts hungry or was non nutritive at the breast.  Pump at least 4 times/day and feed all EBM to baby, may still need formula until supply increases Appointment made for Friday 12/1 at 9 am here. To see Ped on Monday 12/27  Total amount transferred:  10 ml Total supplement given:  43 ml

## 2016-09-01 ENCOUNTER — Ambulatory Visit (HOSPITAL_COMMUNITY)
Admission: RE | Admit: 2016-09-01 | Discharge: 2016-09-01 | Disposition: A | Discharge status: Home / Self Care | Payer: BLUE CROSS/BLUE SHIELD | Source: Ambulatory Visit | Attending: Obstetrics and Gynecology | Admitting: Obstetrics and Gynecology

## 2016-09-01 NOTE — Lactation Note (Signed)
Lactation Consult  Mother's reason for visit:mother here for follow up  weight check and feeding assessment.  Visit Type: feeding assessment Appointment Notes: Mother states that infant is consistently on the breast and when she pumps she doesn't get much. Consult:  Follow-Up Lactation Consultant:  Michel BickersKendrick, Alexes Menchaca McCoy  ________________________________________________________________________    ________________________________________________________________________  Mother's Name: Victoria BaloAshley French Type of delivery:  vag del Breastfeeding Experience: none Maternal Medical Conditions:  Thyroid and Pregnancy induced hypertension Maternal Medications: Levothroxine, metformin  ________________________________________________________________________  Breastfeeding History (Post Discharge)  Frequency of breastfeeding:  Every 2 hours Duration of feeding:30-45 mins   Mother is supplementing with ebm and some formula 2 to 4 times daily  Pumping  Type of pump:  Medela pump in style Frequency: 4 times daily Volume:  60 ml    Infant Intake and Output Assessment  Voids:18  in 24 hrs.  Color:  Clear yellow Stools:7  in 24 hrs.  Color:  Yellow  ________________________________________________________________________  Maternal Breast Assessment  Breast:  Full Nipple:  Erect Pain level:  0 Pain interventions:  Bra  _______________________________________________________________________   Initial feeding assessment: Mother independently latches infant . Infant opened wide with good depth. Observed infant with swallows with good pattern. Infant suckled on and off for 30 mins.  Infant's oral assessment:  WNL  Positioning:  Cross cradle Left breast  LATCH documentation:  Latch:  2 = Grasps breast easily, tongue down, lips flanged, rhythmical sucking.  Audible swallowing:  2 = Spontaneous and intermittent  Type of nipple:  2 = Everted at rest and after stimulation  Comfort  (Breast/Nipple):  1 = Filling, red/small blisters or bruises, mild/mod discomfort  Hold (Positioning):  2 = No assistance needed to correctly position infant at breast  LATCH score:  9  Attached assessment:  Deep  Lips flanged:  Yes.    Lips untucked:  Yes.    Suck assessment:  Displays both     Pre-feed weight:  2960, 6-8.7 ) Post-feed weight: 3010, 6-10.2  Amount transferred:  50 ml   Infant's oral assessment:  WNL  Positioning:  Football Right breast  LATCH documentation:  Latch:  2 = Grasps breast easily, tongue down, lips flanged, rhythmical sucking.  Audible swallowing:  2 = Spontaneous and intermittent  Type of nipple:  2 = Everted at rest and after stimulation  Comfort (Breast/Nipple):  1 = Filling, red/small blisters or bruises, mild/mod discomfort  Hold (Positioning):  2 = No assistance needed to correctly position infant at breast  LATCH score:  9  Attached assessment:  Deep  Lips flanged:  Yes.    Lips untucked:  Yes.    Suck assessment:  Displays both   Pre-feed weight: 3078,  Post-feed weight; 3102 Amount transferred:30  ml     Total amount transferred: 80 ml  Plan Mother to continue to breastfeed 8-12 times in 24 hours Cue base feed and allow for grow spurts with cluster feeding Use breast compression as needed to stimulate suckling and milk flow Mother to post pump 2 times daily Follow up to [facility]

## 2018-05-02 LAB — OB RESULTS CONSOLE RPR: RPR: NONREACTIVE

## 2018-05-02 LAB — OB RESULTS CONSOLE HIV ANTIBODY (ROUTINE TESTING): HIV: NONREACTIVE

## 2018-05-02 LAB — OB RESULTS CONSOLE ABO/RH: RH TYPE: POSITIVE

## 2018-05-02 LAB — OB RESULTS CONSOLE GC/CHLAMYDIA
Chlamydia: NEGATIVE
Gonorrhea: NEGATIVE

## 2018-05-02 LAB — OB RESULTS CONSOLE RUBELLA ANTIBODY, IGM: Rubella: IMMUNE

## 2018-05-02 LAB — OB RESULTS CONSOLE ANTIBODY SCREEN: Antibody Screen: NEGATIVE

## 2018-05-02 LAB — OB RESULTS CONSOLE HEPATITIS B SURFACE ANTIGEN: HEP B S AG: NEGATIVE

## 2018-10-02 NOTE — L&D Delivery Note (Signed)
Delivery Note Pt pushed for approx and at 8:04 PM a viable female was delivered via Vaginal, Spontaneous (Presentation:OA ;  ).  APGAR: 9,9, ; weight pending.   Placenta status:intact, schultz , .  Cord: 3vc with the following complications:none .  Cord pH: n/a Hymenal tag removed  Anesthesia:  Epidural Episiotomy: None Lacerations: None Suture Repair: 3.0 vicryl Est. Blood Loss (mL): 102  Mom to postpartum.  Baby to Couplet care / Skin to Skin  Desires circumcision in hospital.  [facility] 11/29/2018, 8:20 PM

## 2018-11-13 LAB — OB RESULTS CONSOLE GBS: GBS: POSITIVE

## 2018-11-20 ENCOUNTER — Encounter (HOSPITAL_COMMUNITY): Payer: Self-pay | Admitting: *Deleted

## 2018-11-20 ENCOUNTER — Telehealth (HOSPITAL_COMMUNITY): Payer: Self-pay | Admitting: *Deleted

## 2018-11-20 NOTE — Telephone Encounter (Signed)
Preadmission screen  

## 2018-11-28 NOTE — H&P (Signed)
Victoria French is a 33 y.o.G2P1001 female presenting at 62 1/[redacted]wks gestation for Scheduled elective iol for term pregnancy with favorable cervix. Pt is dated per LMP; confirmed with a first trimester [place]. 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 She had an elevated BP early in pregnancy but self resolved. Hx depression - was stable on zoloft through pregnancy. She is GBS positive  OB History    Gravida  2   Para  1   Term  1   Preterm  0   AB  0   Living  1     SAB  0   TAB  0   Ectopic  0   Multiple  0   Live Births  1          Past Medical History:  Diagnosis Date  . Hypothyroidism    No past surgical history on file. Family History: family history is not on file. Social History:  reports that she has never smoked. She has never used smokeless tobacco. She reports that she does not drink alcohol or use drugs.     Maternal Diabetes: No Genetic Screening: Normal Maternal Ultrasounds/Referrals: Normal Fetal Ultrasounds or other Referrals:  None Maternal Substance Abuse:  No Significant Maternal Medications:  Meds include: Zoloft Syntroid Significant Maternal Lab Results:  Lab values include: Group B Strep positive Other Comments:  None  Review of Systems  Constitutional: Positive for malaise/fatigue. Negative for chills, fever and weight loss.  Eyes: Negative for blurred vision.  Respiratory: Positive for shortness of breath.   Cardiovascular: Negative for chest pain and palpitations.  Gastrointestinal: Positive for abdominal pain. Negative for heartburn, nausea and vomiting.  Genitourinary: Negative for dysuria.  Musculoskeletal: Positive for back pain.  Skin: Negative for itching and rash.  Neurological: Negative for dizziness and headaches.  Endo/Heme/Allergies: Negative for environmental  allergies. Does not bruise/bleed easily.  Psychiatric/Behavioral: Negative for depression, hallucinations, substance abuse and suicidal ideas. The patient is not nervous/anxious.    Maternal Medical History:  Reason for admission: Nausea. Scheduled elective iol for term pregnancy with favorable cervix  Contractions: Frequency: rare.   Perceived severity is mild.    Fetal activity: Perceived fetal activity is normal.   Last perceived fetal movement was within the past hour.    Prenatal complications: no prenatal complications Prenatal Complications - Diabetes: none.      Last menstrual period 02/27/2018, unknown if currently breastfeeding. Maternal Exam:  Uterine Assessment: Contraction strength is mild.  Contraction frequency is rare.   Abdomen: Patient reports generalized tenderness.  Estimated fetal weight is AGA.   Fetal presentation: vertex  Introitus: Normal vulva. Vulva is negative for condylomata and lesion.  Normal vagina.  Vagina is negative for condylomata and discharge.  Pelvis: adequate for delivery.   Cervix: Cervix evaluated by digital exam.     Physical Exam  Constitutional: She is oriented to person, place, and time. She appears well-developed and well-nourished.  Neck: Normal range of motion.  Cardiovascular: Normal rate.  Respiratory: Effort normal.  GI: Soft. There is generalized abdominal tenderness.  Genitourinary:    Vulva, vagina and uterus normal.     No vulval condylomata or lesion noted.     No vaginal discharge.   Musculoskeletal: Normal range of motion.        General: Edema present.  Neurological: She  is alert and oriented to person, place, and time.  Skin: Skin is warm.  Psychiatric: She has a normal mood and affect. Her behavior is normal. Judgment and thought content normal.    Prenatal labs: ABO, Rh: AB/Positive/-- (08/01 0000) Antibody: Negative (08/01 0000) Rubella: Immune (08/01 0000) RPR: Nonreactive (08/01 0000)  HBsAg:  Negative (08/01 0000)  HIV: Non-reactive (08/01 0000)  GBS:   pos  Assessment/Plan: 32yo G2P1001 presenting at 56 1/7wks for scheduled elective iol for term pregnancy with favorable cervix PCN for GBS treatment Cytotec for ripening then pitocin/AROM Pain control prn Anticipate svd   Victorino Fatzinger W Shineka Auble 11/28/2018, 4:53 PM

## 2018-11-28 NOTE — H&P (Deleted)
  The note originally documented on this encounter has been moved the the encounter in which it belongs.  

## 2018-11-29 ENCOUNTER — Inpatient Hospital Stay (HOSPITAL_COMMUNITY)
Admission: AD | Admit: 2018-11-29 | Discharge: 2018-12-01 | DRG: 768 | Disposition: A | Discharge status: Home / Self Care | Payer: BLUE CROSS/BLUE SHIELD | Attending: Obstetrics and Gynecology | Admitting: Obstetrics and Gynecology

## 2018-11-29 ENCOUNTER — Inpatient Hospital Stay (HOSPITAL_COMMUNITY)
Admission: RE | Admit: 2018-11-29 | Discharge: 2018-11-29 | Disposition: A | Discharge status: Home / Self Care | Payer: BLUE CROSS/BLUE SHIELD | Source: Ambulatory Visit | Attending: Obstetrics and Gynecology | Admitting: Obstetrics and Gynecology

## 2018-11-29 ENCOUNTER — Encounter (HOSPITAL_COMMUNITY): Payer: Self-pay

## 2018-11-29 ENCOUNTER — Other Ambulatory Visit: Payer: Self-pay

## 2018-11-29 ENCOUNTER — Inpatient Hospital Stay (HOSPITAL_COMMUNITY): Payer: BLUE CROSS/BLUE SHIELD | Admitting: Anesthesiology

## 2018-11-29 DIAGNOSIS — N898 Other specified noninflammatory disorders of vagina: Secondary | ICD-10-CM | POA: Diagnosis present

## 2018-11-29 DIAGNOSIS — Z3A39 39 weeks gestation of pregnancy: Secondary | ICD-10-CM | POA: Diagnosis not present

## 2018-11-29 DIAGNOSIS — O99284 Endocrine, nutritional and metabolic diseases complicating childbirth: Secondary | ICD-10-CM | POA: Diagnosis present

## 2018-11-29 DIAGNOSIS — E282 Polycystic ovarian syndrome: Secondary | ICD-10-CM | POA: Diagnosis present

## 2018-11-29 DIAGNOSIS — E039 Hypothyroidism, unspecified: Secondary | ICD-10-CM | POA: Diagnosis present

## 2018-11-29 DIAGNOSIS — O99824 Streptococcus B carrier state complicating childbirth: Secondary | ICD-10-CM | POA: Diagnosis present

## 2018-11-29 DIAGNOSIS — Z3490 Encounter for supervision of normal pregnancy, unspecified, unspecified trimester: Secondary | ICD-10-CM

## 2018-11-29 DIAGNOSIS — O26893 Other specified pregnancy related conditions, third trimester: Secondary | ICD-10-CM | POA: Diagnosis present

## 2018-11-29 HISTORY — DX: Polycystic ovarian syndrome: E28.2

## 2018-11-29 HISTORY — DX: Anxiety disorder, unspecified: F41.9

## 2018-11-29 LAB — CBC
HCT: 36.2 % (ref 36.0–46.0)
HEMOGLOBIN: 11.5 g/dL — AB (ref 12.0–15.0)
MCH: 29 pg (ref 26.0–34.0)
MCHC: 31.8 g/dL (ref 30.0–36.0)
MCV: 91.4 fL (ref 80.0–100.0)
Platelets: 360 10*3/uL (ref 150–400)
RBC: 3.96 MIL/uL (ref 3.87–5.11)
RDW: 14.4 % (ref 11.5–15.5)
WBC: 11.4 10*3/uL — ABNORMAL HIGH (ref 4.0–10.5)
nRBC: 0 % (ref 0.0–0.2)

## 2018-11-29 LAB — ABO/RH: ABO/RH(D): AB POS

## 2018-11-29 LAB — TYPE AND SCREEN
ABO/RH(D): AB POS
Antibody Screen: NEGATIVE

## 2018-11-29 LAB — RPR: RPR Ser Ql: NONREACTIVE

## 2018-11-29 MED ORDER — LACTATED RINGERS IV SOLN: 500.0000 mL | INTRAVENOUS | Status: DC | PRN

## 2018-11-29 MED ORDER — BENZOCAINE-MENTHOL 20-0.5 % EX AERO: 1.0000 "application " | INHALATION_SPRAY | CUTANEOUS | Status: DC | PRN

## 2018-11-29 MED ORDER — MISOPROSTOL 25 MCG QUARTER TABLET
25.0000 ug | ORAL_TABLET | ORAL | Status: DC | PRN
  Administered 2018-11-29 (×2): 25 ug via VAGINAL
  Filled 2018-11-29 (×2): qty 1

## 2018-11-29 MED ORDER — WITCH HAZEL-GLYCERIN EX PADS: 1.0000 "application " | MEDICATED_PAD | CUTANEOUS | Status: DC | PRN

## 2018-11-29 MED ORDER — ACETAMINOPHEN 325 MG PO TABS: 650.0000 mg | ORAL_TABLET | ORAL | Status: DC | PRN

## 2018-11-29 MED ORDER — TERBUTALINE SULFATE 1 MG/ML IJ SOLN: 0.2500 mg | Freq: Once | INTRAMUSCULAR | Status: DC | PRN

## 2018-11-29 MED ORDER — COCONUT OIL OIL: 1.0000 "application " | TOPICAL_OIL | Status: DC | PRN

## 2018-11-29 MED ORDER — FENTANYL-BUPIVACAINE-NACL 0.5-0.125-0.9 MG/250ML-% EP SOLN
12.0000 mL/h | EPIDURAL | Status: DC | PRN
  Administered 2018-11-29: 12 mL/h via EPIDURAL
  Filled 2018-11-29: qty 250

## 2018-11-29 MED ORDER — BUTORPHANOL TARTRATE 1 MG/ML IJ SOLN: 1.0000 mg | INTRAMUSCULAR | Status: DC | PRN

## 2018-11-29 MED ORDER — ONDANSETRON HCL 4 MG/2ML IJ SOLN: 4.0000 mg | Freq: Four times a day (QID) | INTRAMUSCULAR | Status: DC | PRN

## 2018-11-29 MED ORDER — OXYCODONE HCL 5 MG PO TABS: 10.0000 mg | ORAL_TABLET | ORAL | Status: DC | PRN

## 2018-11-29 MED ORDER — OXYTOCIN BOLUS FROM INFUSION
500.0000 mL | Freq: Once | INTRAVENOUS | Status: AC
  Administered 2018-11-29: 500 mL via INTRAVENOUS

## 2018-11-29 MED ORDER — LIDOCAINE HCL (PF) 1 % IJ SOLN
INTRAMUSCULAR | Status: DC | PRN
  Administered 2018-11-29: 5 mL via EPIDURAL

## 2018-11-29 MED ORDER — DIBUCAINE 1 % RE OINT: 1.0000 "application " | TOPICAL_OINTMENT | RECTAL | Status: DC | PRN

## 2018-11-29 MED ORDER — SERTRALINE HCL 50 MG PO TABS
50.0000 mg | ORAL_TABLET | Freq: Every day | ORAL | Status: DC
  Administered 2018-11-29 – 2018-11-30 (×2): 50 mg via ORAL
  Filled 2018-11-29 (×2): qty 1

## 2018-11-29 MED ORDER — SOD CITRATE-CITRIC ACID 500-334 MG/5ML PO SOLN: 30.0000 mL | ORAL | Status: DC | PRN

## 2018-11-29 MED ORDER — LEVOTHYROXINE SODIUM 50 MCG PO TABS
25.0000 ug | ORAL_TABLET | Freq: Every day | ORAL | Status: DC
  Administered 2018-11-30 – 2018-12-01 (×2): 25 ug via ORAL
  Filled 2018-11-29 (×2): qty 1

## 2018-11-29 MED ORDER — OXYCODONE-ACETAMINOPHEN 5-325 MG PO TABS: 1.0000 | ORAL_TABLET | ORAL | Status: DC | PRN

## 2018-11-29 MED ORDER — LACTATED RINGERS IV SOLN
INTRAVENOUS | Status: DC
  Administered 2018-11-29 (×3): via INTRAVENOUS

## 2018-11-29 MED ORDER — DIPHENHYDRAMINE HCL 25 MG PO CAPS: 25.0000 mg | ORAL_CAPSULE | Freq: Four times a day (QID) | ORAL | Status: DC | PRN

## 2018-11-29 MED ORDER — ZOLPIDEM TARTRATE 5 MG PO TABS: 5.0000 mg | ORAL_TABLET | Freq: Every evening | ORAL | Status: DC | PRN

## 2018-11-29 MED ORDER — ONDANSETRON HCL 4 MG PO TABS: 4.0000 mg | ORAL_TABLET | ORAL | Status: DC | PRN

## 2018-11-29 MED ORDER — SODIUM CHLORIDE (PF) 0.9 % IJ SOLN
INTRAMUSCULAR | Status: DC | PRN
  Administered 2018-11-29: 12 mL/h via EPIDURAL

## 2018-11-29 MED ORDER — LIDOCAINE HCL (PF) 1 % IJ SOLN: 30.0000 mL | INTRAMUSCULAR | Status: DC | PRN

## 2018-11-29 MED ORDER — SODIUM CHLORIDE 0.9 % IV SOLN
5.0000 10*6.[IU] | Freq: Once | INTRAVENOUS | Status: AC
  Administered 2018-11-29: 5 10*6.[IU] via INTRAVENOUS
  Filled 2018-11-29: qty 5

## 2018-11-29 MED ORDER — TETANUS-DIPHTH-ACELL PERTUSSIS 5-2.5-18.5 LF-MCG/0.5 IM SUSP: 0.5000 mL | Freq: Once | INTRAMUSCULAR | Status: DC

## 2018-11-29 MED ORDER — SENNOSIDES-DOCUSATE SODIUM 8.6-50 MG PO TABS
2.0000 | ORAL_TABLET | ORAL | Status: DC
  Administered 2018-11-29 – 2018-11-30 (×2): 2 via ORAL
  Filled 2018-11-29 (×2): qty 2

## 2018-11-29 MED ORDER — ONDANSETRON HCL 4 MG/2ML IJ SOLN: 4.0000 mg | INTRAMUSCULAR | Status: DC | PRN

## 2018-11-29 MED ORDER — OXYTOCIN 40 UNITS IN NORMAL SALINE INFUSION - SIMPLE MED
1.0000 m[IU]/min | INTRAVENOUS | Status: DC
  Administered 2018-11-29: 2 m[IU]/min via INTRAVENOUS
  Filled 2018-11-29: qty 1000

## 2018-11-29 MED ORDER — IBUPROFEN 600 MG PO TABS
600.0000 mg | ORAL_TABLET | Freq: Four times a day (QID) | ORAL | Status: DC
  Administered 2018-11-30 (×4): 600 mg via ORAL
  Filled 2018-11-29 (×7): qty 1

## 2018-11-29 MED ORDER — PHENYLEPHRINE 40 MCG/ML (10ML) SYRINGE FOR IV PUSH (FOR BLOOD PRESSURE SUPPORT)
80.0000 ug | PREFILLED_SYRINGE | INTRAVENOUS | Status: DC | PRN
  Filled 2018-11-29: qty 10

## 2018-11-29 MED ORDER — PRENATAL MULTIVITAMIN CH
1.0000 | ORAL_TABLET | Freq: Every day | ORAL | Status: DC
  Administered 2018-11-30: 1 via ORAL
  Filled 2018-11-29: qty 1

## 2018-11-29 MED ORDER — OXYCODONE HCL 5 MG PO TABS: 5.0000 mg | ORAL_TABLET | ORAL | Status: DC | PRN

## 2018-11-29 MED ORDER — LACTATED RINGERS IV SOLN
500.0000 mL | Freq: Once | INTRAVENOUS | Status: AC
  Administered 2018-11-29: 1000 mL via INTRAVENOUS

## 2018-11-29 MED ORDER — SIMETHICONE 80 MG PO CHEW: 80.0000 mg | CHEWABLE_TABLET | ORAL | Status: DC | PRN

## 2018-11-29 MED ORDER — PENICILLIN G 3 MILLION UNITS IVPB - SIMPLE MED
3.0000 10*6.[IU] | INTRAVENOUS | Status: DC
  Administered 2018-11-29 (×4): 3 10*6.[IU] via INTRAVENOUS
  Filled 2018-11-29 (×4): qty 100

## 2018-11-29 MED ORDER — OXYTOCIN 40 UNITS IN NORMAL SALINE INFUSION - SIMPLE MED
2.5000 [IU]/h | INTRAVENOUS | Status: DC
  Administered 2018-11-29: 2.5 [IU]/h via INTRAVENOUS

## 2018-11-29 MED ORDER — OXYCODONE-ACETAMINOPHEN 5-325 MG PO TABS: 2.0000 | ORAL_TABLET | ORAL | Status: DC | PRN

## 2018-11-29 MED ORDER — EPHEDRINE 5 MG/ML INJ: 10.0000 mg | INTRAVENOUS | Status: DC | PRN

## 2018-11-29 NOTE — Anesthesia Preprocedure Evaluation (Signed)
Anesthesia Evaluation  Patient identified by MRN, date of birth, ID band Patient awake    Reviewed: Allergy & Precautions, NPO status , Patient's Chart, lab work & pertinent test results  Airway Mallampati: II  TM Distance: >3 FB Neck ROM: Full    Dental no notable dental hx. (+) Teeth Intact   Pulmonary former smoker,    Pulmonary exam normal breath sounds clear to auscultation       Cardiovascular hypertension, Normal cardiovascular exam Rhythm:Regular Rate:Normal  ?PIH   Neuro/Psych Anxiety    GI/Hepatic   Endo/Other  Hypothyroidism   Renal/GU      Musculoskeletal   Abdominal (+) + obese,   Peds  Hematology Plts 360    Anesthesia Other Findings   Reproductive/Obstetrics (+) Pregnancy                             Lab Results  Component Value Date   WBC 11.4 (H) 11/29/2018   HGB 11.5 (L) 11/29/2018   HCT 36.2 11/29/2018   MCV 91.4 11/29/2018   PLT 360 11/29/2018    Anesthesia Physical Anesthesia Plan  ASA: III  Anesthesia Plan: Epidural   Post-op Pain Management:    Induction:   PONV Risk Score and Plan: Treatment may vary due to age or medical condition  Airway Management Planned:   Additional Equipment:   Intra-op Plan:   Post-operative Plan:   Informed Consent: I have reviewed the patients History and Physical, chart, labs and discussed the procedure including the risks, benefits and alternatives for the proposed anesthesia with the patient or authorized representative who has indicated his/her understanding and acceptance.       Plan Discussed with:   Anesthesia Plan Comments: (Hx of Hypothyroid and HtN for LEA)        Anesthesia Quick Evaluation

## 2018-11-29 NOTE — Progress Notes (Signed)
Patient ID: Victoria French, female   DOB: 1985/12/13, 33 y.o.   MRN: [id number] Pt rates ctxs at 1-2/10. +Fms VSS 150, cat 1 Ctxs q 4-5/80/-2  Plan: start pitocin for augmentation          Anticipate svd

## 2018-11-29 NOTE — Anesthesia Pain Management Evaluation Note (Signed)
  CRNA Pain Management Visit Note  Patient: Victoria French, 33 y.o., female  "Hello I am a member of the anesthesia team at [facility]. We have an anesthesia team available at all times to provide care throughout the hospital, including epidural management and anesthesia for C-section. I don't know your plan for the delivery whether it a natural birth, water birth, IV sedation, nitrous supplementation, doula or epidural, but we want to meet your pain goals."   1.Was your pain managed to your expectations on prior hospitalizations?   Yes   2.What is your expectation for pain management during this hospitalization?     Epidural  3.How can we help you reach that goal? epidural  Record the patient's initial score and the patient's pain goal.   Pain: 0  Pain Goal: 8 [facility] wants you to be able to say your pain was always managed very well.  Rica Records 11/29/2018

## 2018-11-29 NOTE — Progress Notes (Signed)
Patient ID: Victoria French, female   DOB: June 27, 1986, 33 y.o.   MRN: [id number] Pt doing well. No complaints. +Fms VS - 137/81 CAT 1, 155 Irregular contractions  A/P: G2P1001 at 39 2/[redacted]wks gestation s/p cytotec x 2 for ripening          S/P PCN for GBS treatment          AROM - clear fluid          Augment with pitocin if indicated          Pain control prn

## 2018-11-29 NOTE — Anesthesia Procedure Notes (Signed)
Epidural Patient location during procedure: OB Start time: 11/29/2018 3:59 PM End time: 11/29/2018 4:21 PM  Staffing Anesthesiologist: Trevor Iha, MD Performed: anesthesiologist   Preanesthetic Checklist Completed: patient identified, site marked, surgical consent, pre-op evaluation, timeout performed, IV checked, risks and benefits discussed and monitors and equipment checked  Epidural Patient position: sitting Prep: site prepped and draped and DuraPrep Patient monitoring: continuous pulse ox and blood pressure Approach: midline Location: L3-L4 Injection technique: LOR air  Needle:  Needle type: Tuohy  Needle gauge: 17 G Needle length: 9 cm and 9 Needle insertion depth: 9 cm Catheter type: closed end flexible Catheter size: 19 Gauge Catheter at skin depth: 15 cm Test dose: negative  Assessment Events: blood not aspirated, injection not painful, no injection resistance, negative IV test and no paresthesia  Additional Notes Patient identified. Risks/Benefits/Options discussed with patient including but not limited to bleeding, infection, nerve damage, paralysis, failed block, incomplete pain control, headache, blood pressure changes, nausea, vomiting, reactions to medication both or allergic, itching and postpartum back pain. Confirmed with bedside nurse the patient's most recent platelet count. Confirmed with patient that they are not currently taking any anticoagulation, have any bleeding history or any family history of bleeding disorders. Patient expressed understanding and wished to proceed. All questions were answered. Sterile technique was used throughout the entire procedure. Please see nursing notes for vital signs. Test dose was given through epidural needle and negative prior to continuing to dose epidural or start infusion. Warning signs of high block given to the patient including shortness of breath, tingling/numbness in hands, complete motor block, or any  concerning symptoms with instructions to call for help. Patient was given instructions on fall risk and not to get out of bed. All questions and concerns addressed with instructions to call with any issues. 1 Attempt (S) . Patient tolerated procedure well.

## 2018-11-29 NOTE — Anesthesia Pain Management Evaluation Note (Signed)
  CRNA Pain Management Visit Note  Patient: Victoria French, 33 y.o., female  "Hello I am a member of the anesthesia team at [facility]. We have an anesthesia team available at all times to provide care throughout the hospital, including epidural management and anesthesia for C-section. I don't know your plan for the delivery whether it a natural birth, water birth, IV sedation, nitrous supplementation, doula or epidural, but we want to meet your pain goals."   1.Was your pain managed to your expectations on prior hospitalizations?   No prior hospitalizations  2.What is your expectation for pain management during this hospitalization?     Epidural  3.How can we help you reach that goal? Epidural infusing, patient comfortable, feeling pressure  Record the patient's initial score and the patient's pain goal.   Pain: 3  Pain Goal: 6 [facility] wants you to be able to say your pain was always managed very well.  [facility] 11/29/2018

## 2018-11-29 NOTE — Progress Notes (Signed)
Patient ID: Victoria French, female   DOB: 01/22/1986, 33 y.o.   MRN: [id number] Pt doing well. Finally comfortable with epidural +Fms VSS EFM - cat 1, 150 TOCO - ctxs irregularly SVE - ant lip - reduced with push  A/P: G2P1001 complete ; no pressure         Trial push attempted; not much descent         Will labor down and then begin pushing         Anticipate svd

## 2018-11-30 LAB — CBC
HCT: 33.3 % — ABNORMAL LOW (ref 36.0–46.0)
Hemoglobin: 11 g/dL — ABNORMAL LOW (ref 12.0–15.0)
MCH: 29.7 pg (ref 26.0–34.0)
MCHC: 33 g/dL (ref 30.0–36.0)
MCV: 90 fL (ref 80.0–100.0)
PLATELETS: 278 10*3/uL (ref 150–400)
RBC: 3.7 MIL/uL — ABNORMAL LOW (ref 3.87–5.11)
RDW: 14.6 % (ref 11.5–15.5)
WBC: 12.7 10*3/uL — ABNORMAL HIGH (ref 4.0–10.5)
nRBC: 0 % (ref 0.0–0.2)

## 2018-11-30 NOTE — Progress Notes (Signed)
Patient ID: Victoria French, female   DOB: 1986-05-06, 33 y.o.   MRN: [id number] Pt doing well with no complaints. Well rested. Some cramping with breastfeeding as expected. Lochia mild. No fever, chills, SOB or CP. Bonding well with baby.  VSS - had an elevated BP of 140s/92 when laying on arm ABD - FF and 2cm below umbilicus EXT - mild edema, no homans  12.7>11<278  A/P: G2P2002 on PPD#1 s/p svd - stable          BP stable          Routine pp care          Likely discharge to home tomorrow          Circumcision today

## 2018-11-30 NOTE — Anesthesia Postprocedure Evaluation (Signed)
Anesthesia Post Note  Patient: Victoria French  Procedure(s) Performed: AN AD HOC LABOR EPIDURAL     Patient location during evaluation: Mother Baby Anesthesia Type: Epidural Level of consciousness: awake and alert Pain management: pain level controlled Vital Signs Assessment: post-procedure vital signs reviewed and stable Respiratory status: spontaneous breathing, nonlabored ventilation and respiratory function stable Cardiovascular status: stable Postop Assessment: no headache, no backache, epidural receding, no apparent nausea or vomiting, patient able to bend at knees, able to ambulate and adequate PO intake Anesthetic complications: no    Last Vitals:  Vitals:   11/30/18 0324 11/30/18 0647  BP: (!) 140/92 (!) 142/92  Pulse: 82 83  Resp: 16 16  Temp: 36.6 C 36.5 C  SpO2: 99% 98%    Last Pain:  Vitals:   11/30/18 0647  TempSrc: Oral  PainSc:    Pain Goal: Patients Stated Pain Goal: 6 (11/29/18 0720)                 Laban Emperor

## 2018-11-30 NOTE — Lactation Note (Signed)
This note was copied from a baby's chart. Lactation Consultation Note  Patient Name: Victoria French [id number] Date: 11/30/2018 Reason for consult: Initial assessment;Term P2. 10 hour female infant. Per mom, she feels infant is latching better than he was few hours ago. Per parents infant had 2 wet and 2 voids since delivery. Mom has DEBP at home. Mom latched infant on right breast using the cradle hold, infant breastfeed for 30 minutes and swallows could be heard. Mom's nipple was well rounded when infant came off breast.  Mom plans to exclusively breastfeed infant longer than she did her first son.  LC discussed I & O. Reviewed Baby & Me book's Breastfeeding Basics.  Mom knows to call Nurse or LC if she has any questions, concerns or need assistance with latching infant to breast. Mom made aware of O/P services, breastfeeding support groups, community resources, and our phone # for post-discharge questions.   Maternal Data Formula Feeding for Exclusion: Yes Reason for exclusion: Mother's choice to formula and breast feed on admission Has patient been taught Hand Expression?: Yes Does the patient have breastfeeding experience prior to this delivery?: Yes  Feeding Feeding Type: Breast Milk  LATCH Score Latch: Grasps breast easily, tongue down, lips flanged, rhythmical sucking.  Audible Swallowing: Spontaneous and intermittent  Type of Nipple: Everted at rest and after stimulation  Comfort (Breast/Nipple): Soft / non-tender  Hold (Positioning): Assistance needed to correctly position infant at breast and maintain latch.  LATCH Score: 9  Interventions Interventions: Breast feeding basics reviewed;Skin to skin;Breast massage;Hand express;Support pillows;Adjust position;Expressed milk  Lactation Tools Discussed/Used WIC Program: No   Consult Status Consult Status: Follow-up Date: 12/01/18 Follow-up type: In-patient    Danelle Earthly 11/30/2018, 6:36 AM

## 2018-11-30 NOTE — Lactation Note (Signed)
This note was copied from a baby's chart. Lactation Consultation Note  Patient Name: Victoria French [id number] Date: 11/30/2018 Reason for consult: Follow-up assessment;Term;Nipple pain/trauma  F/U visit with P2 mom, baby is 26 hrs old with 3% wt loss. Baby had circumcision @ 1500 today.  Mom was only able to partially breastfeed her last child for 6 weeks after milk supply diminished and difficulty latching from the start.  Mom states baby has been agitated all afternoon and has been at the breast for extended periods of time. Mom states she thinks he isn't breastfeeding out of hunger but more for comfort. Family member at bedside attempting to soothe infant with pacifier. Mom states [facility] just gave another dose of Tylenol as infant spit up the first dose with procedure. Infant continues to show feeding cues and becoming fussy. Offered to assist mom with latch and mom agrees.  First asked mom about hand expression and mom admits that she hasn't been doing it. Demonstrated to mom and drops expressed from both breasts. Discussed breast massage and hand expression prior to latching infant.   Mom with red compression stripe on both nipples and near breakdown on right nipple. Mom states she has been using coconut oil intermittently today. Mom given comfort gels and instructed to use up to 5 times; discouraged from using with coconut oil. Gels placed in fridge until mom  ready to use.  Mom attempted to latch baby onto left breast in cradle hold position. Instructed mom to switch her hands to cross cradle position to have better control of infant's head. Baby very aggressive at this point and mom allowed baby to grasp onto tip of nipple and suckle. Demonstrated breaking the seal and waiting for infant to open wide then quickly guiding baby onto the breast. Mom reports comfortable latch with these adjustments. Infant suckling aggressively at first then slowed with swallows noted. Infant fell asleep after  15 minutes and mom was able to produce a burp.   Mom initially with questions regarding if infant is getting enough and questions his frequent breastfeeding associated with hunger or if he just wants to breastfeed for comfort. Mom states she really wants to avoid formula if possible. Informed mom that there are other ways of introducing formula besides a bottle such as SNS or feeding tube. Mom states she will think about it and see how the baby does through the night with feeds. Reassured mom that colostrum expressed by [facility] hand expression and infant appears content at this time after swallows heard with feeding. Encouraged mom that she is doing well and try not to be discouraged.   Maternal Data Has patient been taught Hand Expression?: Yes(demonstrated again) Does the patient have breastfeeding experience prior to this delivery?: Yes  Feeding Feeding Type: Breast Fed  LATCH Score Latch: Grasps breast easily, tongue down, lips flanged, rhythmical sucking.  Audible Swallowing: Spontaneous and intermittent  Type of Nipple: Everted at rest and after stimulation  Comfort (Breast/Nipple): Filling, red/small blisters or bruises, mild/mod discomfort  Hold (Positioning): Assistance needed to correctly position infant at breast and maintain latch.  LATCH Score: 8  Interventions Interventions: Breast feeding basics reviewed;Assisted with latch;Skin to skin;Breast massage;Hand express;Comfort gels;Coconut oil;Position options;Support pillows;Adjust position;Breast compression  Lactation Tools Discussed/Used Tools: Comfort gels;Coconut oil   Consult Status Consult Status: Follow-up Date: 12/01/18 Follow-up type: In-patient    Virgia Land 11/30/2018, 10:58 PM

## 2018-11-30 NOTE — Progress Notes (Signed)
Mother of baby was referred for history of depression. Referral screened out by CSW because per chart review, [name] is actively taking 50 mg of Zoloft daily to address her symptoms. No significant events during prenatal course.   Please contact CSW if mother of baby requests, if needs arise, or if mother of baby scores greater than a nine or answers yes to question ten on [place] Postpartum Depression Screen.   Edwin Dada, MSW, LCSW-A Clinical Social Worker [facility] [employer] [phone]

## 2018-12-01 MED ORDER — IBUPROFEN 600 MG PO TABS: 600.0000 mg | ORAL_TABLET | Freq: Four times a day (QID) | ORAL | 1 refills | Status: DC | PRN

## 2018-12-01 NOTE — Lactation Note (Signed)
This note was copied from a baby's chart. Lactation Consultation Note  Patient Name: Victoria French [id number] Date: 12/01/2018   Mom w/hx of PCOS & low milk supply with her 1st child (now 31yo), but she did notice + breast changes (larger) w/this pregnancy & she has been leaking for the last several weeks.  Infant cluster-fed last night & described behavior consistent with a hungry infant not getting enough volume (latching vigorously & then pulling off) . Per Mom, her breasts do not yet feel heavier. I explained infant's behavior to Mom (and considering past history) suggested giving formula instead of giving a pacifier. Mom understands that if she does give formula, she should pump afterwards to signal her body to make more milk.   Infant sleeping soundly in Mother's arms during consult.   Lurline Hare [facility] 12/01/2018, 9:37 AM

## 2018-12-01 NOTE — Discharge Summary (Signed)
OB Discharge Summary     Patient Name: Victoria French DOB: 04/18/1986 MRN: [id number]  Date of admission: 11/29/2018 Delivering MD: [facility]   Date of discharge: 12/01/2018  Admitting diagnosis: ctx Intrauterine pregnancy: [redacted]w[redacted]d     Secondary diagnosis:  Active Problems:   Term pregnancy   SVD (spontaneous vaginal delivery)   Postpartum care following vaginal delivery  Additional problems: none     Discharge diagnosis: Term Pregnancy Delivered                                                                                                Post partum procedures:none  Augmentation: AROM, Pitocin and Cytotec  Complications: None  Hospital course:  Induction of Labor With Vaginal Delivery   33 y.o. yo [id number] at [redacted]w[redacted]d was admitted to the hospital 11/29/2018 for induction of labor.  Indication for induction: Favorable cervix at term.  Patient had an uncomplicated labor course as follows: Membrane Rupture Time/Date: 9:15 AM ,11/29/2018   Intrapartum Procedures: Episiotomy: None [1]                                         Lacerations:  None [1]  Patient had delivery of a Viable infant.  Information for the patient's newborn:  Dynesha, Zelmer [[id number]]  Delivery Method: Vaginal, Spontaneous(Filed from Delivery Summary)   11/29/2018  Details of delivery can be found in separate delivery note.  Patient had a routine postpartum course. Patient is discharged home 12/01/18.  Physical exam  Vitals:   11/30/18 0647 11/30/18 1014 11/30/18 1401 11/30/18 2231  BP: (!) 142/92 134/81 122/70 139/85  Pulse: 83 92 90 91  Resp: 16 18 16 18   Temp: 97.7 F (36.5 C) 98.8 F (37.1 C) 98.1 F (36.7 C) 98.4 F (36.9 C)  TempSrc: Oral Oral Oral Oral  SpO2: 98%  99%   Weight:      Height:       General: alert, cooperative and no distress Lochia: appropriate Uterine Fundus: firm Incision: N/A DVT Evaluation: No evidence of DVT seen on physical exam. Mild calf/ankle  edema. Labs: Lab Results  Component Value Date   WBC 12.7 (H) 11/30/2018   HGB 11.0 (L) 11/30/2018   HCT 33.3 (L) 11/30/2018   MCV 90.0 11/30/2018   PLT 278 11/30/2018   CMP Latest Ref Rng & Units 08/21/2016  Glucose 65 - 99 mg/dL 381(R)  BUN 6 - 20 mg/dL 7  Creatinine 7.11 - 6.57 mg/dL 9.03  Sodium 833 - 383 mmol/L 135  Potassium 3.5 - 5.1 mmol/L 4.4  Chloride 101 - 111 mmol/L 106  CO2 22 - 32 mmol/L 21(L)  Calcium 8.9 - 10.3 mg/dL 9.4  Total Protein 6.5 - 8.1 g/dL 6.6  Total Bilirubin 0.3 - 1.2 mg/dL 0.4  Alkaline Phos 38 - 126 U/L 144(H)  AST 15 - 41 U/L 18  ALT 14 - 54 U/L 18    Discharge instruction: per After Visit Summary and "Baby and Me Booklet".  After  visit meds:  Allergies as of 12/01/2018      Reactions   Tegretol [carbamazepine] Hives      Medication List    STOP taking these medications   metFORMIN 500 MG tablet Commonly known as:  GLUCOPHAGE     TAKE these medications   calcium carbonate 500 MG chewable tablet Commonly known as:  TUMS - dosed in mg elemental calcium Chew 2 tablets by mouth 3 (three) times daily as needed for indigestion or heartburn.   ibuprofen 600 MG tablet Commonly known as:  ADVIL,MOTRIN Take 1 tablet (600 mg total) by mouth every 6 (six) hours as needed for moderate pain or cramping.   levothyroxine 25 MCG tablet Commonly known as:  SYNTHROID, LEVOTHROID Take 25 mcg by mouth daily before breakfast.   prenatal multivitamin Tabs tablet Take 1 tablet by mouth daily at 12 noon.   sertraline 50 MG tablet Commonly known as:  ZOLOFT Take 50 mg by mouth at bedtime.       Diet: routine diet  Activity: Advance as tolerated. Pelvic rest for 6 weeks.   Outpatient follow up:6 weeks Follow up Appt:No future appointments. Follow up Visit:No follow-ups on file.  Postpartum contraception: Not Discussed  Newborn Data: Live born female  Birth Weight: 7 lb 5.6 oz (3334 g) APGAR: 9, 9  Newborn Delivery   Birth date/time:   11/29/2018 20:04:00 Delivery type:  Vaginal, Spontaneous     Baby Feeding: Bottle and Breast Disposition:home with mother   12/01/2018 Cathrine Muster, DO

## 2018-12-01 NOTE — Progress Notes (Signed)
Patient ID: Victoria French, female   DOB: [birth year], 33 y.o.   MRN: [id number] Pt doing well. Fatigued from baby cluster feeding last night. Denies CP/SOB or HA. Lochia mild. Pain well controlled. Voiding well. Ready for discharge to home today VSS ABD - FF and 4cm below umbilicus EXT - no homans, mild edema   A/P: PPD#2 s/p svd - stable         Discharge instructions reviewed: F/U in 6 weeks for pp visit

## 2018-12-01 NOTE — Lactation Note (Signed)
This note was copied from a baby's chart. Lactation Consultation Note  Patient Name: Victoria French TJQZE'S Date: 12/01/2018 Reason for consult: Follow-up assessment;Mother's request P2, 29 hour female infant. LC entered room infant mom holding infant and infant suckling on pacifier. LC discussed possible risk of pacifier usage,it has non nutritive sucking and  may interfere with hunger cues,  infant might mot latch as well and may cause mom to have low milk volume. Per mom, infant has been feeding frequently at breast,  LC discussed with mom this is normal behavior at 29 hours infant is cluster feeding. Per mom, she BF less than 2 hours ago, doesn't want to  BF  infant at this time, infant actively sucking on pacifier, LC suggested maybe mom hand express and give infant back volume on spoon. Mom stated she prefers not to do hand expression, infant is quiet with  pacifier and she prefers to hold him.  LC stated she understood we are here to help her in her choice or decision , if she needs LC services please call us. Mom knows to breastfeed infant according hunger cues, and mom is aware of cluster feeding when an infant is 24 hours of age.  LC politely walked out of the room.   Maternal Data Has patient been taught Hand Expression?: Yes(demonstrated again) Does the patient have breastfeeding experience prior to this delivery?: Yes  Feeding Feeding Type: Breast Fed  LATCH Score Latch: Grasps breast easily, tongue down, lips flanged, rhythmical sucking.  Audible Swallowing: Spontaneous and intermittent  Type of Nipple: Everted at rest and after stimulation  Comfort (Breast/Nipple): Filling, red/small blisters or bruises, mild/mod discomfort  Hold (Positioning): Assistance needed to correctly position infant at breast and maintain latch.  LATCH Score: 8  Interventions Interventions: Breast feeding basics reviewed;Assisted with latch;Skin to skin;Breast massage;Hand express;Comfort  gels;Coconut oil;Position options;Support pillows;Adjust position;Breast compression  Lactation Tools Discussed/Used Tools: Comfort gels;Coconut oil   Consult Status Consult Status: Follow-up Date: 12/01/18 Follow-up type: In-patient    Danelle Earthly 12/01/2018, 1:36 AM

## 2020-01-23 ENCOUNTER — Other Ambulatory Visit: Payer: Self-pay | Admitting: Obstetrics and Gynecology

## 2020-01-23 DIAGNOSIS — N6320 Unspecified lump in the left breast, unspecified quadrant: Secondary | ICD-10-CM

## 2020-02-12 ENCOUNTER — Other Ambulatory Visit: Payer: BLUE CROSS/BLUE SHIELD

## 2020-02-13 ENCOUNTER — Ambulatory Visit
Admission: RE | Admit: 2020-02-13 | Discharge: 2020-02-13 | Disposition: A | Discharge status: Home / Self Care | Payer: BLUE CROSS/BLUE SHIELD | Source: Ambulatory Visit | Attending: Obstetrics and Gynecology | Admitting: Obstetrics and Gynecology

## 2020-02-13 ENCOUNTER — Other Ambulatory Visit: Payer: Self-pay

## 2020-02-13 ENCOUNTER — Ambulatory Visit
Admission: RE | Admit: 2020-02-13 | Discharge: 2020-02-13 | Disposition: A | Discharge status: Home / Self Care | Payer: BC Managed Care – PPO | Source: Ambulatory Visit | Attending: Obstetrics and Gynecology | Admitting: Obstetrics and Gynecology

## 2020-02-13 DIAGNOSIS — N6320 Unspecified lump in the left breast, unspecified quadrant: Secondary | ICD-10-CM

## 2021-03-23 IMAGING — US US BREAST*L* LIMITED INC AXILLA
1 series · 2 of 2 positions shown · non-contrast
Comparison: None.

CLINICAL DATA: Palpable abnormality in the LOWER LEFT breast on
recent physical exam.

EXAM:
DIGITAL DIAGNOSTIC BILATERAL MAMMOGRAM WITH CAD AND TOMO
ULTRASOUND LEFT BREAST

[Series 1: us breast*left* limited inc axilla · 0.06mm/px · 2 of 2 slices shown]
[im 1/2]
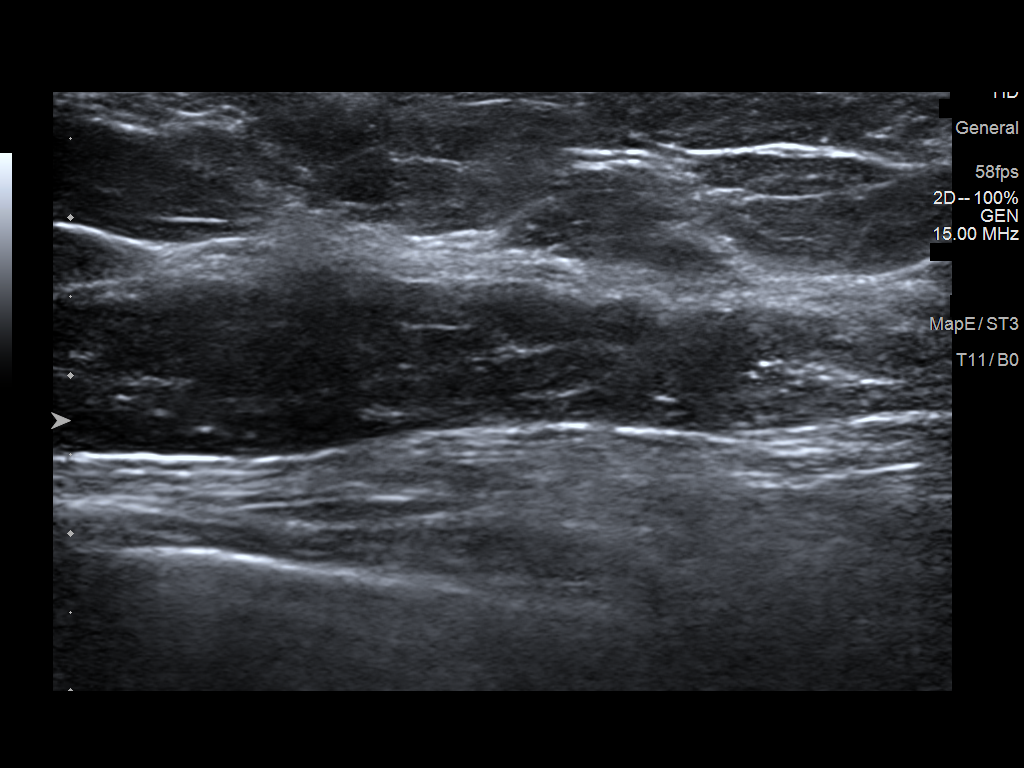
[im 2/2]
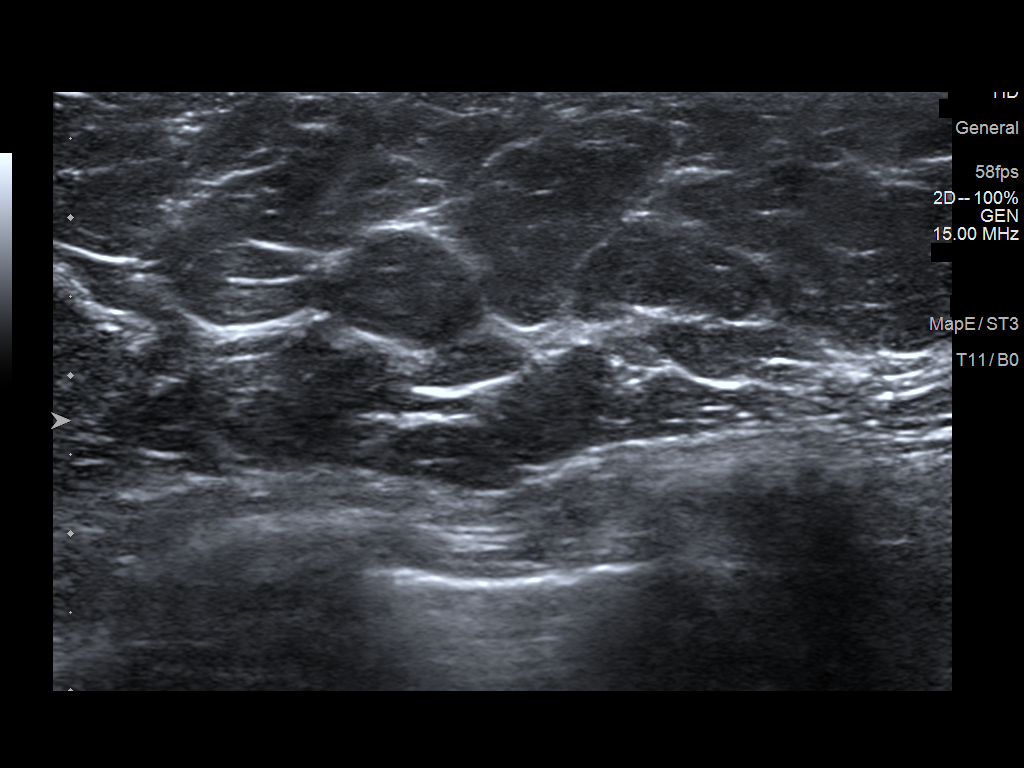

[2 of 2 positions shown; findings below may reference images not displayed]

ACR Breast Density Category b: There are scattered areas of
fibroglandular density.
FINDINGS: No suspicious mass, distortion, or microcalcifications are
identified to suggest presence of malignancy. Spot tangential view
is performed in the area of patient's concern in the LOWER central
portion of the LEFT breast. This view shows normal appearing
fibrofatty tissue without mass.

Mammographic images were processed with CAD.

On physical exam, I palpate a soft ridge of tissue along the MEDIAL
inframammary fold region of the LEFT breast. I palpate no discrete
mass.

Targeted ultrasound is performed, showing normal appearing
fibroglandular tissue along the inframammary fold in the MEDIAL
portion of the LEFT breast. No suspicious mass, distortion, or
acoustic shadowing is demonstrated with ultrasound.
IMPRESSION: No mammographic or ultrasound evidence for malignancy.

RECOMMENDATION:
Screening mammogram at age 40 unless there are persistent or
intervening clinical concerns. (Code:59-Q-T1H)

I have discussed the findings and recommendations with the patient.
If applicable, a reminder letter will be sent to the patient
regarding the next appointment.

BI-RADS CATEGORY  2: Benign.

## 2021-10-02 NOTE — L&D Delivery Note (Signed)
Delivery Note Victoria French is a [id number] at [redacted]w[redacted]d who had a spontaneous delivery at 0501 a viable female was delivered via LOA.  APGAR: 9, 9; weight 6lb15.8oz (3170g) .     Admitted for elevated blood pressures in office. Induction of labor for severe preeclampsia. Induced with pitocin and AROM. Progressed normally. Received epidural for pain management. Pushed for <5 minutes. Baby was delivered without difficulty. Nuchal and body cord x 1.  Delayed cord clamping for 60 seconds. Delivery of placenta was spontaneous. Placenta was found to be intact, 3 -vessel cord was noted. The fundus was found to be firm. Perineum intact. Estimated blood loss 103cc. Instrument and gauze counts were correct at the end of the procedure.   Placenta status: to L&D for disposal  Anesthesia:  epidural Episiotomy:  none Lacerations:  none Suture Repair: N/A Est. Blood Loss (mL):  103  Mom to stay on magnesium x 24 hours for severe preeclampsia. Dicussed unlikely for tubal, plan for outpatient, but will discuss with oncoming OB on call.   Mom to postpartum.  Baby to Couplet care / Skin to Skin.  Rowland Lathe 07/21/2022, 5:29 AM

## 2022-01-03 LAB — HEPATITIS C ANTIBODY: HCV Ab: NEGATIVE

## 2022-01-03 LAB — OB RESULTS CONSOLE GC/CHLAMYDIA
Chlamydia: NEGATIVE
Neisseria Gonorrhea: NEGATIVE

## 2022-01-03 LAB — OB RESULTS CONSOLE HEPATITIS B SURFACE ANTIGEN: Hepatitis B Surface Ag: NEGATIVE

## 2022-01-03 LAB — OB RESULTS CONSOLE RUBELLA ANTIBODY, IGM: Rubella: NON-IMMUNE/NOT IMMUNE

## 2022-01-03 LAB — OB RESULTS CONSOLE RPR: RPR: NONREACTIVE

## 2022-01-03 LAB — OB RESULTS CONSOLE HIV ANTIBODY (ROUTINE TESTING): HIV: NONREACTIVE

## 2022-06-26 LAB — OB RESULTS CONSOLE GBS: GBS: NEGATIVE

## 2022-07-12 ENCOUNTER — Inpatient Hospital Stay (HOSPITAL_COMMUNITY)
Admission: AD | Admit: 2022-07-12 | Discharge: 2022-07-12 | Disposition: A | Discharge status: Home / Self Care | Payer: BC Managed Care – PPO | Attending: Obstetrics and Gynecology | Admitting: Obstetrics and Gynecology

## 2022-07-12 ENCOUNTER — Encounter (HOSPITAL_COMMUNITY): Payer: Self-pay

## 2022-07-12 DIAGNOSIS — Z3A38 38 weeks gestation of pregnancy: Secondary | ICD-10-CM | POA: Insufficient documentation

## 2022-07-12 DIAGNOSIS — O368130 Decreased fetal movements, third trimester, not applicable or unspecified: Secondary | ICD-10-CM | POA: Diagnosis not present

## 2022-07-12 DIAGNOSIS — O163 Unspecified maternal hypertension, third trimester: Secondary | ICD-10-CM

## 2022-07-12 DIAGNOSIS — O10913 Unspecified pre-existing hypertension complicating pregnancy, third trimester: Secondary | ICD-10-CM | POA: Insufficient documentation

## 2022-07-12 DIAGNOSIS — O169 Unspecified maternal hypertension, unspecified trimester: Secondary | ICD-10-CM

## 2022-07-12 DIAGNOSIS — O26893 Other specified pregnancy related conditions, third trimester: Secondary | ICD-10-CM | POA: Diagnosis present

## 2022-07-12 DIAGNOSIS — Z3493 Encounter for supervision of normal pregnancy, unspecified, third trimester: Secondary | ICD-10-CM

## 2022-07-12 DIAGNOSIS — Z3689 Encounter for other specified antenatal screening: Secondary | ICD-10-CM | POA: Diagnosis not present

## 2022-07-12 LAB — PROTEIN / CREATININE RATIO, URINE
Creatinine, Urine: 78 mg/dL
Protein Creatinine Ratio: 0.17 mg/mg{Cre} — ABNORMAL HIGH (ref 0.00–0.15)
Total Protein, Urine: 13 mg/dL

## 2022-07-12 LAB — COMPREHENSIVE METABOLIC PANEL
ALT: 12 U/L (ref 0–44)
AST: 16 U/L (ref 15–41)
Albumin: 2.5 g/dL — ABNORMAL LOW (ref 3.5–5.0)
Alkaline Phosphatase: 158 U/L — ABNORMAL HIGH (ref 38–126)
Anion gap: 7 (ref 5–15)
BUN: <5 mg/dL — ABNORMAL LOW (ref 6–20)
CO2: 23 mmol/L (ref 22–32)
Calcium: 8.7 mg/dL — ABNORMAL LOW (ref 8.9–10.3)
Chloride: 105 mmol/L (ref 98–111)
Creatinine, Ser: 0.73 mg/dL (ref 0.44–1.00)
GFR, Estimated: >60 mL/min (ref >60)
Glucose, Bld: 92 mg/dL (ref 70–99)
Potassium: 4.2 mmol/L (ref 3.5–5.1)
Sodium: 135 mmol/L (ref 135–145)
Total Bilirubin: 0.4 mg/dL (ref 0.3–1.2)
Total Protein: 5.7 g/dL — ABNORMAL LOW (ref 6.5–8.1)

## 2022-07-12 LAB — URINALYSIS, ROUTINE W REFLEX MICROSCOPIC
Bilirubin Urine: NEGATIVE
Glucose, UA: NEGATIVE mg/dL
Hgb urine dipstick: NEGATIVE
Ketones, ur: NEGATIVE mg/dL
Nitrite: NEGATIVE
Protein, ur: NEGATIVE mg/dL
Specific Gravity, Urine: 1.012 (ref 1.005–1.030)
pH: 7 (ref 5.0–8.0)

## 2022-07-12 LAB — CBC
HCT: 34.9 % — ABNORMAL LOW (ref 36.0–46.0)
Hemoglobin: 11.5 g/dL — ABNORMAL LOW (ref 12.0–15.0)
MCH: 28.8 pg (ref 26.0–34.0)
MCHC: 33 g/dL (ref 30.0–36.0)
MCV: 87.5 fL (ref 80.0–100.0)
Platelets: 350 10*3/uL (ref 150–400)
RBC: 3.99 MIL/uL (ref 3.87–5.11)
RDW: 14.3 % (ref 11.5–15.5)
WBC: 9.8 10*3/uL (ref 4.0–10.5)
nRBC: 0.2 % (ref 0.0–0.2)

## 2022-07-12 NOTE — MAU Provider Note (Signed)
History     CSN: [id number]  Arrival date and time: 07/12/22 1735   Event Date/Time   First Provider Initiated Contact with Patient 07/12/22 1801      Chief Complaint  Patient presents with   Decreased Fetal Movement   HPI  Victoria French is a 36 y.o. [id number] at [redacted]w[redacted]d who presents for evaluation of decreased fetal movement. Patient reports she has not felt the baby move in 2 hours. She denies any pain.  She denies any vaginal bleeding, discharge, and leaking of fluid. Denies any constipation, diarrhea or any urinary complaints.  Since her arrival to MAU, patient reports the baby is moving normally.   Patient was found to be hypertensive. Denies any hx of hypertension in the pregnancy. Denies any HA, visual changes or epigastric pain.   OB History     Gravida  3   Para  2   Term  2   Preterm  0   AB  0   Living  2      SAB  0   IAB  0   Ectopic  0   Multiple  0   Live Births  2           Past Medical History:  Diagnosis Date   Anxiety    Hypothyroidism    PCOS (polycystic ovarian syndrome)    SVD (spontaneous vaginal delivery) 11/29/2018    Past Surgical History:  Procedure Laterality Date   LASER ABLATION OF THE CERVIX     WISDOM TOOTH EXTRACTION      Family History  Problem Relation Age of Onset   Hypertension Mother    Miscarriages / Stillbirths Mother    Heart disease Maternal Grandmother    Cancer Maternal Grandfather     Social History   Tobacco Use   Smoking status: Former    Years: 5.00    Types: Cigarettes   Smokeless tobacco: Never   Tobacco comments:    Quit in 2013  Substance Use Topics   Alcohol use: No   Drug use: No    Allergies:  Allergies  Allergen Reactions   Tegretol [Carbamazepine] Hives    Medications Prior to Admission  Medication Sig Dispense Refill Last Dose   famotidine (PEPCID) 20 MG tablet Take 20 mg by mouth 2 (two) times daily.   07/11/2022   Prenatal Vit-Fe Fumarate-FA  (PRENATAL MULTIVITAMIN) TABS tablet Take 1 tablet by mouth daily at 12 noon.   07/11/2022   calcium carbonate (TUMS - DOSED IN MG ELEMENTAL CALCIUM) 500 MG chewable tablet Chew 2 tablets by mouth 3 (three) times daily as needed for indigestion or heartburn.      ibuprofen (ADVIL,MOTRIN) 600 MG tablet Take 1 tablet (600 mg total) by mouth every 6 (six) hours as needed for moderate pain or cramping. 40 tablet 1    levothyroxine (SYNTHROID, LEVOTHROID) 25 MCG tablet Take 25 mcg by mouth daily before breakfast.      sertraline (ZOLOFT) 50 MG tablet Take 100 mg by mouth at bedtime.       Review of Systems  Constitutional: Negative.  Negative for fatigue and fever.  HENT: Negative.    Respiratory: Negative.  Negative for shortness of breath.   Cardiovascular: Negative.  Negative for chest pain.  Gastrointestinal: Negative.  Negative for abdominal pain, constipation, diarrhea, nausea and vomiting.  Genitourinary: Negative.  Negative for dysuria, vaginal bleeding and vaginal discharge.  Neurological: Negative.  Negative for dizziness and headaches.   Physical  Exam   Blood pressure 135/88, pulse 91, temperature 98 F (36.7 C), temperature source Oral, resp. rate 14, SpO2 97 %, unknown if currently breastfeeding.  Patient Vitals for the past 24 hrs:  BP Temp Temp src Pulse Resp SpO2  07/12/22 1915 135/88 -- -- 91 -- 97 %  07/12/22 1901 136/88 -- -- 98 -- --  07/12/22 1846 (!) 143/86 -- -- 91 -- --  07/12/22 1831 139/83 -- -- 87 -- --  07/12/22 1816 (!) 142/85 -- -- 88 -- --  07/12/22 1756 (!) 148/86 98 F (36.7 C) Oral 88 14 97 %    Physical Exam Vitals and nursing note reviewed.  Constitutional:      General: She is not in acute distress.    Appearance: She is well-developed.  HENT:     Head: Normocephalic.  Eyes:     Pupils: Pupils are equal, round, and reactive to light.  Cardiovascular:     Rate and Rhythm: Normal rate and regular rhythm.     Heart sounds: Normal heart sounds.   Pulmonary:     Effort: Pulmonary effort is normal. No respiratory distress.     Breath sounds: Normal breath sounds.  Abdominal:     General: Bowel sounds are normal. There is no distension.     Palpations: Abdomen is soft.     Tenderness: There is no abdominal tenderness.  Skin:    General: Skin is warm and dry.  Neurological:     Mental Status: She is alert and oriented to person, place, and time.  Psychiatric:        Mood and Affect: Mood normal.        Behavior: Behavior normal.        Thought Content: Thought content normal.        Judgment: Judgment normal.     Fetal Tracing:  Baseline: 145 Variability: moderate Accels: 15x15 Decels: none  Toco: none   MAU Course  Procedures  Results for orders placed or performed during the hospital encounter of 07/12/22 (from the past 24 hour(s))  Protein / creatinine ratio, urine     Status: Abnormal   Collection Time: 07/12/22  5:58 PM  Result Value Ref Range   Creatinine, Urine 78 mg/dL   Total Protein, Urine 13 mg/dL   Protein Creatinine Ratio 0.17 (H) 0.00 - 0.15 mg/mg[Cre]  Urinalysis, Routine w reflex microscopic Urine, Clean Catch     Status: Abnormal   Collection Time: 07/12/22  5:58 PM  Result Value Ref Range   Color, Urine YELLOW YELLOW   APPearance HAZY (A) CLEAR   Specific Gravity, Urine 1.012 1.005 - 1.030   pH 7.0 5.0 - 8.0   Glucose, UA NEGATIVE NEGATIVE mg/dL   Hgb urine dipstick NEGATIVE NEGATIVE   Bilirubin Urine NEGATIVE NEGATIVE   Ketones, ur NEGATIVE NEGATIVE mg/dL   Protein, ur NEGATIVE NEGATIVE mg/dL   Nitrite NEGATIVE NEGATIVE   Leukocytes,Ua TRACE (A) NEGATIVE   RBC / HPF 0-5 0 - 5 RBC/hpf   WBC, UA 0-5 0 - 5 WBC/hpf   Bacteria, UA RARE (A) NONE SEEN   Squamous Epithelial / LPF 11-20 0 - 5   Mucus PRESENT   CBC     Status: Abnormal   Collection Time: 07/12/22  6:28 PM  Result Value Ref Range   WBC 9.8 4.0 - 10.5 K/uL   RBC 3.99 3.87 - 5.11 MIL/uL   Hemoglobin 11.5 (L) 12.0 - 15.0 g/dL    HCT 34.9 (L) 36.0 -  46.0 %   MCV 87.5 80.0 - 100.0 fL   MCH 28.8 26.0 - 34.0 pg   MCHC 33.0 30.0 - 36.0 g/dL   RDW 14.3 11.5 - 15.5 %   Platelets 350 150 - 400 K/uL   nRBC 0.2 0.0 - 0.2 %  Comprehensive metabolic panel     Status: Abnormal   Collection Time: 07/12/22  6:28 PM  Result Value Ref Range   Sodium 135 135 - 145 mmol/L   Potassium 4.2 3.5 - 5.1 mmol/L   Chloride 105 98 - 111 mmol/L   CO2 23 22 - 32 mmol/L   Glucose, Bld 92 70 - 99 mg/dL   BUN <5 (L) 6 - 20 mg/dL   Creatinine, Ser 0.73 0.44 - 1.00 mg/dL   Calcium 8.7 (L) 8.9 - 10.3 mg/dL   Total Protein 5.7 (L) 6.5 - 8.1 g/dL   Albumin 2.5 (L) 3.5 - 5.0 g/dL   AST 16 15 - 41 U/L   ALT 12 0 - 44 U/L   Alkaline Phosphatase 158 (H) 38 - 126 U/L   Total Bilirubin 0.4 0.3 - 1.2 mg/dL   GFR, Estimated >60 >60 mL/min   Anion gap 7 5 - 15    MDM Prenatal records from community office reviewed. Pregnancy complicated by uncomplicated. Labs ordered and reviewed.   NST reactive Patient indicating normal fetal movement with clicker  UA CBC, CMP, Protein/creat ratio  Dr. Harrington Challenger notified of new onset HTN with follow up already scheduled in office tomorrow. Discussed with patient if BP remains elevated, she will meet criteria for GHTN and recommend IOL. Reviewed preeclampsia warning signs at length.  Assessment and Plan   1. NST (non-stress test) reactive   2. Movement of fetus present during pregnancy in third trimester   3. [redacted] weeks gestation of pregnancy   4. Elevated blood pressure affecting pregnancy, antepartum     -Discharge home in stable condition -Preeclampsia precautions discussed -Patient advised to follow-up with OB as scheduled tomorrow for prenatal care -Patient may return to MAU as needed or if her condition were to change or worsen  Wende Mott, CNM 07/12/2022, 7:31 PM

## 2022-07-12 NOTE — Discharge Instructions (Signed)

## 2022-07-12 NOTE — MAU Note (Signed)
.  Victoria French is a 36 y.o. at [redacted]w[redacted]d here in MAU reporting: DFM today, states she has felt baby move some but has it has significantly decreased and it has been two hours since she has felt the baby move at all. Denies VB or LOF. No pain currently.   Pain score: 0 FHT:162

## 2022-07-20 ENCOUNTER — Inpatient Hospital Stay (HOSPITAL_COMMUNITY): Payer: BC Managed Care – PPO | Admitting: Anesthesiology

## 2022-07-20 ENCOUNTER — Inpatient Hospital Stay (HOSPITAL_COMMUNITY)
Admission: AD | Admit: 2022-07-20 | Discharge: 2022-07-23 | DRG: 807 | Disposition: A | Discharge status: Home / Self Care | Payer: BC Managed Care – PPO | Attending: Obstetrics and Gynecology | Admitting: Obstetrics and Gynecology

## 2022-07-20 ENCOUNTER — Encounter (HOSPITAL_COMMUNITY): Payer: Self-pay | Admitting: Obstetrics and Gynecology

## 2022-07-20 ENCOUNTER — Other Ambulatory Visit: Payer: Self-pay

## 2022-07-20 DIAGNOSIS — F32A Depression, unspecified: Secondary | ICD-10-CM | POA: Diagnosis present

## 2022-07-20 DIAGNOSIS — O99344 Other mental disorders complicating childbirth: Secondary | ICD-10-CM | POA: Diagnosis present

## 2022-07-20 DIAGNOSIS — Z23 Encounter for immunization: Secondary | ICD-10-CM

## 2022-07-20 DIAGNOSIS — O134 Gestational [pregnancy-induced] hypertension without significant proteinuria, complicating childbirth: Secondary | ICD-10-CM | POA: Diagnosis present

## 2022-07-20 DIAGNOSIS — O99284 Endocrine, nutritional and metabolic diseases complicating childbirth: Secondary | ICD-10-CM | POA: Diagnosis present

## 2022-07-20 DIAGNOSIS — O99214 Obesity complicating childbirth: Secondary | ICD-10-CM | POA: Diagnosis present

## 2022-07-20 DIAGNOSIS — E282 Polycystic ovarian syndrome: Secondary | ICD-10-CM | POA: Diagnosis present

## 2022-07-20 DIAGNOSIS — Z3A39 39 weeks gestation of pregnancy: Secondary | ICD-10-CM

## 2022-07-20 DIAGNOSIS — O139 Gestational [pregnancy-induced] hypertension without significant proteinuria, unspecified trimester: Secondary | ICD-10-CM

## 2022-07-20 DIAGNOSIS — O1404 Mild to moderate pre-eclampsia, complicating childbirth: Secondary | ICD-10-CM | POA: Diagnosis present

## 2022-07-20 DIAGNOSIS — Z87891 Personal history of nicotine dependence: Secondary | ICD-10-CM | POA: Diagnosis not present

## 2022-07-20 LAB — COMPREHENSIVE METABOLIC PANEL
ALT: 12 U/L (ref 0–44)
AST: 17 U/L (ref 15–41)
Albumin: 2.5 g/dL — ABNORMAL LOW (ref 3.5–5.0)
Alkaline Phosphatase: 154 U/L — ABNORMAL HIGH (ref 38–126)
Anion gap: 10 (ref 5–15)
BUN: 7 mg/dL (ref 6–20)
CO2: 21 mmol/L — ABNORMAL LOW (ref 22–32)
Calcium: 9.1 mg/dL (ref 8.9–10.3)
Chloride: 106 mmol/L (ref 98–111)
Creatinine, Ser: 0.64 mg/dL (ref 0.44–1.00)
GFR, Estimated: >60 mL/min (ref >60)
Glucose, Bld: 107 mg/dL — ABNORMAL HIGH (ref 70–99)
Potassium: 4 mmol/L (ref 3.5–5.1)
Sodium: 137 mmol/L (ref 135–145)
Total Bilirubin: 0.3 mg/dL (ref 0.3–1.2)
Total Protein: 5.8 g/dL — ABNORMAL LOW (ref 6.5–8.1)

## 2022-07-20 LAB — CBC
HCT: 35.1 % — ABNORMAL LOW (ref 36.0–46.0)
HCT: 33.9 % — ABNORMAL LOW (ref 36.0–46.0)
Hemoglobin: 11.8 g/dL — ABNORMAL LOW (ref 12.0–15.0)
Hemoglobin: 11.4 g/dL — ABNORMAL LOW (ref 12.0–15.0)
MCH: 29.1 pg (ref 26.0–34.0)
MCH: 29.2 pg (ref 26.0–34.0)
MCHC: 33.6 g/dL (ref 30.0–36.0)
MCHC: 33.6 g/dL (ref 30.0–36.0)
MCV: 86.5 fL (ref 80.0–100.0)
MCV: 86.9 fL (ref 80.0–100.0)
Platelets: 373 10*3/uL (ref 150–400)
Platelets: 330 10*3/uL (ref 150–400)
RBC: 4.06 MIL/uL (ref 3.87–5.11)
RBC: 3.9 MIL/uL (ref 3.87–5.11)
RDW: 14.6 % (ref 11.5–15.5)
RDW: 14.7 % (ref 11.5–15.5)
WBC: 9.3 10*3/uL (ref 4.0–10.5)
WBC: 11.2 10*3/uL — ABNORMAL HIGH (ref 4.0–10.5)
nRBC: 0 % (ref 0.0–0.2)
nRBC: 0 % (ref 0.0–0.2)

## 2022-07-20 LAB — TYPE AND SCREEN
ABO/RH(D): AB POS
Antibody Screen: NEGATIVE

## 2022-07-20 LAB — PROTEIN / CREATININE RATIO, URINE
Creatinine, Urine: 164 mg/dL
Protein Creatinine Ratio: 0.21 mg/mg{Cre} — ABNORMAL HIGH (ref 0.00–0.15)
Total Protein, Urine: 35 mg/dL

## 2022-07-20 MED ORDER — EPHEDRINE 5 MG/ML INJ: 10.0000 mg | INTRAVENOUS | Status: DC | PRN

## 2022-07-20 MED ORDER — FENTANYL CITRATE (PF) 100 MCG/2ML IJ SOLN: 50.0000 ug | INTRAMUSCULAR | Status: DC | PRN

## 2022-07-20 MED ORDER — LACTATED RINGERS IV SOLN: 500.0000 mL | Freq: Once | INTRAVENOUS | Status: DC

## 2022-07-20 MED ORDER — HYDRALAZINE HCL 20 MG/ML IJ SOLN: 10.0000 mg | INTRAMUSCULAR | Status: DC | PRN

## 2022-07-20 MED ORDER — LABETALOL HCL 5 MG/ML IV SOLN: 40.0000 mg | INTRAVENOUS | Status: DC | PRN

## 2022-07-20 MED ORDER — LACTATED RINGERS IV SOLN: INTRAVENOUS | Status: DC

## 2022-07-20 MED ORDER — OXYTOCIN-SODIUM CHLORIDE 30-0.9 UT/500ML-% IV SOLN
2.5000 [IU]/h | INTRAVENOUS | Status: DC
  Filled 2022-07-20: qty 500

## 2022-07-20 MED ORDER — PHENYLEPHRINE 80 MCG/ML (10ML) SYRINGE FOR IV PUSH (FOR BLOOD PRESSURE SUPPORT)
80.0000 ug | PREFILLED_SYRINGE | INTRAVENOUS | Status: DC | PRN
  Administered 2022-07-20: 80 ug via INTRAVENOUS

## 2022-07-20 MED ORDER — LACTATED RINGERS IV SOLN: 500.0000 mL | INTRAVENOUS | Status: DC | PRN

## 2022-07-20 MED ORDER — LABETALOL HCL 5 MG/ML IV SOLN
20.0000 mg | INTRAVENOUS | Status: DC | PRN
  Administered 2022-07-20: 20 mg via INTRAVENOUS
  Filled 2022-07-20: qty 4

## 2022-07-20 MED ORDER — LABETALOL HCL 5 MG/ML IV SOLN: 80.0000 mg | INTRAVENOUS | Status: DC | PRN

## 2022-07-20 MED ORDER — OXYCODONE-ACETAMINOPHEN 5-325 MG PO TABS: 2.0000 | ORAL_TABLET | ORAL | Status: DC | PRN

## 2022-07-20 MED ORDER — OXYTOCIN BOLUS FROM INFUSION
333.0000 mL | Freq: Once | INTRAVENOUS | Status: AC
  Administered 2022-07-21: 333 mL via INTRAVENOUS

## 2022-07-20 MED ORDER — MAGNESIUM SULFATE BOLUS VIA INFUSION
4.0000 g | Freq: Once | INTRAVENOUS | Status: AC
  Administered 2022-07-20: 4 g via INTRAVENOUS
  Filled 2022-07-20: qty 1000

## 2022-07-20 MED ORDER — TERBUTALINE SULFATE 1 MG/ML IJ SOLN: 0.2500 mg | Freq: Once | INTRAMUSCULAR | Status: DC | PRN

## 2022-07-20 MED ORDER — MAGNESIUM SULFATE 40 GM/1000ML IV SOLN
2.0000 g/h | INTRAVENOUS | Status: DC
  Filled 2022-07-20 (×2): qty 1000

## 2022-07-20 MED ORDER — SOD CITRATE-CITRIC ACID 500-334 MG/5ML PO SOLN: 30.0000 mL | ORAL | Status: DC | PRN

## 2022-07-20 MED ORDER — DIPHENHYDRAMINE HCL 50 MG/ML IJ SOLN: 12.5000 mg | INTRAMUSCULAR | Status: DC | PRN

## 2022-07-20 MED ORDER — FENTANYL-BUPIVACAINE-NACL 0.5-0.125-0.9 MG/250ML-% EP SOLN
12.0000 mL/h | EPIDURAL | Status: DC | PRN
  Administered 2022-07-20: 12 mL/h via EPIDURAL
  Filled 2022-07-20: qty 250

## 2022-07-20 MED ORDER — OXYCODONE-ACETAMINOPHEN 5-325 MG PO TABS: 1.0000 | ORAL_TABLET | ORAL | Status: DC | PRN

## 2022-07-20 MED ORDER — OXYTOCIN-SODIUM CHLORIDE 30-0.9 UT/500ML-% IV SOLN
1.0000 m[IU]/min | INTRAVENOUS | Status: DC
  Administered 2022-07-20: 2 m[IU]/min via INTRAVENOUS
  Filled 2022-07-20: qty 500

## 2022-07-20 MED ORDER — ACETAMINOPHEN 325 MG PO TABS
650.0000 mg | ORAL_TABLET | ORAL | Status: DC | PRN
  Administered 2022-07-20 – 2022-07-21 (×3): 650 mg via ORAL
  Filled 2022-07-20 (×3): qty 2

## 2022-07-20 MED ORDER — ONDANSETRON HCL 4 MG/2ML IJ SOLN
4.0000 mg | Freq: Four times a day (QID) | INTRAMUSCULAR | Status: DC | PRN
  Administered 2022-07-20 – 2022-07-21 (×2): 4 mg via INTRAVENOUS
  Filled 2022-07-20 (×2): qty 2

## 2022-07-20 MED ORDER — LIDOCAINE HCL (PF) 1 % IJ SOLN: 30.0000 mL | INTRAMUSCULAR | Status: DC | PRN

## 2022-07-20 MED ORDER — PHENYLEPHRINE 80 MCG/ML (10ML) SYRINGE FOR IV PUSH (FOR BLOOD PRESSURE SUPPORT)
80.0000 ug | PREFILLED_SYRINGE | INTRAVENOUS | Status: DC | PRN
  Filled 2022-07-20: qty 10

## 2022-07-20 MED ORDER — LIDOCAINE-EPINEPHRINE (PF) 2 %-1:200000 IJ SOLN
INTRAMUSCULAR | Status: DC | PRN
  Administered 2022-07-20: 5 mL via EPIDURAL

## 2022-07-20 MED ORDER — CALCIUM CARBONATE ANTACID 500 MG PO CHEW
400.0000 mg | CHEWABLE_TABLET | ORAL | Status: DC | PRN
  Administered 2022-07-21: 400 mg via ORAL
  Filled 2022-07-20: qty 2

## 2022-07-20 NOTE — Anesthesia Preprocedure Evaluation (Addendum)
Anesthesia Evaluation  Patient identified by MRN, date of birth, ID band Patient awake    Reviewed: Allergy & Precautions, NPO status , Patient's Chart, lab work & pertinent test results  Airway Mallampati: III  TM Distance: >3 FB Neck ROM: Full    Dental no notable dental hx.    Pulmonary neg pulmonary ROS, former smoker,    Pulmonary exam normal breath sounds clear to auscultation       Cardiovascular hypertension (severe preE on mag), Normal cardiovascular exam Rhythm:Regular Rate:Normal     Neuro/Psych Seizures -, Well Controlled,  PSYCHIATRIC DISORDERS Anxiety    GI/Hepatic negative GI ROS, Neg liver ROS,   Endo/Other  Hypothyroidism Morbid obesity  Renal/GU negative Renal ROS  negative genitourinary   Musculoskeletal negative musculoskeletal ROS (+)   Abdominal   Peds  Hematology negative hematology ROS (+)   Anesthesia Other Findings induction for severe preeclampsia  Reproductive/Obstetrics (+) Pregnancy                            Anesthesia Physical Anesthesia Plan  ASA: 3  Anesthesia Plan: Epidural   Post-op Pain Management:    Induction:   PONV Risk Score and Plan: Treatment may vary due to age or medical condition  Airway Management Planned: Natural Airway  Additional Equipment:   Intra-op Plan:   Post-operative Plan:   Informed Consent: I have reviewed the patients History and Physical, chart, labs and discussed the procedure including the risks, benefits and alternatives for the proposed anesthesia with the patient or authorized representative who has indicated his/her understanding and acceptance.       Plan Discussed with: Anesthesiologist  Anesthesia Plan Comments: (Patient identified. Risks, benefits, options discussed with patient including but not limited to bleeding, infection, nerve damage, paralysis, failed block, incomplete pain control, headache,  blood pressure changes, nausea, vomiting, reactions to medication, itching, and post partum back pain. Confirmed with bedside nurse the patient's most recent platelet count. Confirmed with the patient that they are not taking any anticoagulation, have any bleeding history or any family history of bleeding disorders. Patient expressed understanding and wishes to proceed. All questions were answered. )        Anesthesia Quick Evaluation

## 2022-07-20 NOTE — H&P (Addendum)
Victoria French is a 36 y.o. female [phone] [redacted]w[redacted]d presenting as a direct admission for induction from the office for gestational hypertension, rule out severe preeclampsia. She reports no LOF, VB, Contractions. Normal FM.   Patient was seen in office today and noted to have BP 138/96 and complained of a mild headache. Of note, patient was seen in MAU 07/12/22 and had elevated BP 148/89. Her follow up blood pressures in the office were normal until today. SVE in office today: 3/50/-3  She states mild nagging headache for past 2 days. Hasn't been bothered enough to take anything for it. She denies vision changes and RUQ pain.   On admission, Bps 174/93, repeat 162/98. She was treated with IV labetalol and started on magnesium.   Pregnancy c/b: Obesity: BMI 44 at initial OB appointment. Last growth scan: 07/13/22 AGA 55%ile, 7lbs 5oz, nl afi, vertex, ant placenta. History of hypothyroidism: euthyroid during this pregnancy, not on meds Depression: on sertraline 100mg  daily, stable History of seizure disorder as child: not currently on meds, no recent seizures History of abnormal paps: ASCUS/HPV+ 2022 PCOS: conceived on metformin History of gestational hypertension in first pregnancy  OB History     Gravida  3   Para  2   Term  2   Preterm  0   AB  0   Living  2      SAB  0   IAB  0   Ectopic  0   Multiple  0   Live Births  2          Past Medical History:  Diagnosis Date   Anxiety    Hypothyroidism    PCOS (polycystic ovarian syndrome)    SVD (spontaneous vaginal delivery) 11/29/2018   Past Surgical History:  Procedure Laterality Date   LASER ABLATION OF THE CERVIX     WISDOM TOOTH EXTRACTION     Family History: family history includes Cancer in her maternal grandfather; Heart disease in her maternal grandmother; Hypertension in her mother; Miscarriages / Stillbirths in her mother. Social History:  reports that she has quit smoking. Her smoking use  included cigarettes. She has never used smokeless tobacco. She reports that she does not drink alcohol and does not use drugs.     Maternal Diabetes: No Genetic Screening: Declined Maternal Ultrasounds/Referrals: Normal Fetal Ultrasounds or other Referrals:  None Maternal Substance Abuse:  No Significant Maternal Medications:  Meds include: Zoloft Significant Maternal Lab Results:  Group B Strep negative Other Comments:  None  Review of Systems Per HPI  Today's Vitals   07/20/22 1314 07/20/22 1316 07/20/22 1321 07/20/22 1322  BP: (!) 149/92   (!) 136/90  Pulse: 92   90  SpO2:  98% 99%    There is no height or weight on file to calculate BMI.  Exam Physical Exam  Dilation: 3 Effacement (%): 50 Station: -3 Exam by:: Sonia Baller Adcox, RN Blood pressure (!) 136/90, pulse 90, SpO2 99 %, unknown if currently breastfeeding. Gen: NAD, resting comfortably CVS: Normal pulses Lungs: nonlabored respirations Abd: Gravid abdomen, obese Ext: no calf edema or tenderness  Fetal testing: 135bpm, mod variability, + accels, no decels Toco: acontractile  CBC    Component Value Date/Time   WBC 9.3 07/20/2022 1213   RBC 4.06 07/20/2022 1213   HGB 11.8 (L) 07/20/2022 1213   HCT 35.1 (L) 07/20/2022 1213   PLT 373 07/20/2022 1213   MCV 86.5 07/20/2022 1213   MCH 29.1 07/20/2022 1213  MCHC 33.6 07/20/2022 1213   RDW 14.6 07/20/2022 1213   CMP     Component Value Date/Time   NA 137 07/20/2022 1213   K 4.0 07/20/2022 1213   CL 106 07/20/2022 1213   CO2 21 (L) 07/20/2022 1213   GLUCOSE 107 (H) 07/20/2022 1213   BUN 7 07/20/2022 1213   CREATININE 0.64 07/20/2022 1213   CALCIUM 9.1 07/20/2022 1213   PROT 5.8 (L) 07/20/2022 1213   ALBUMIN 2.5 (L) 07/20/2022 1213   AST 17 07/20/2022 1213   ALT 12 07/20/2022 1213   ALKPHOS 154 (H) 07/20/2022 1213   BILITOT 0.3 07/20/2022 1213   GFRNONAA >60 07/20/2022 1213   GFRAA >60 08/21/2016 1020      Prenatal labs: ABO, Rh:  --/--/AB POS  (10/19 1211) Antibody: NEG (10/19 1211) Rubella: Nonimmune (04/04 0000) RPR: Nonreactive (04/04 0000)  HBsAg: Negative (04/04 0000)  HIV: Non-reactive (04/04 0000)  GBS: Negative/-- (09/25 0000)   Assessment/Plan: 36Y [id number] @ [redacted]w[redacted]d, admit for induction for severe preeclampsia Fetal wellbeing: cat I tracing IOL: start pitocin, plan AROM when fetal station lower Severe preeclampsia: blood pressure now mildly elevated after IV labetalol 20mg . Magnesium bolus started. Admission labs normal, U P/C 0.21. Monitor for signs of worsening preeclampsia or magnesium toxicity. Pain control: epidural on patient request Desire for permanent sterilization: potential for postpartum tubal tomorrow vs. Outpatient tubal.   07/20/2022, 1:47 PM

## 2022-07-20 NOTE — Progress Notes (Signed)
OB Progress Note  S: pt comfortable with epidural   O: Today's Vitals   07/20/22 2245 07/20/22 2300 07/20/22 2330 07/20/22 2336  BP: (!) 144/88 139/82 112/64   Pulse: 95 93 90   Resp: 17 17 17    Temp:      TempSrc:      SpO2:      PainSc:  Asleep  0-No pain   There is no height or weight on file to calculate BMI.  SVE 5-6/50/-2, AROM clear fluid, FSE applied  FHR: 150bpm, mod variability, absent accels, no decels Toco: ctx q4-5 mins   A/P: 36Y G3P2002 @ [redacted]w[redacted]d,  induction for severe preeclampsia Fetal wellbeing: cat 1 tracing IOL: s/p AROM, continue pitocin Severe preeclampsia: on magnesium, normal to mild range blood pressures. Admission labs normal, U P/C 0.21. Monitor for signs of worsening preeclampsia or magnesium toxicity. Pain control: epidural o Desire for permanent sterilization: potential for postpartum tubal tomorrow vs. Outpatient tubal.  Luther Redo, MD 07/20/22 11:58 PM

## 2022-07-20 NOTE — Anesthesia Procedure Notes (Signed)
Epidural Patient location during procedure: OB Start time: 07/20/2022 10:05 PM End time: 07/20/2022 10:15 PM  Staffing Anesthesiologist: Freddrick March, MD Performed: anesthesiologist   Preanesthetic Checklist Completed: patient identified, IV checked, risks and benefits discussed, monitors and equipment checked, pre-op evaluation and timeout performed  Epidural Patient position: sitting Prep: DuraPrep and site prepped and draped Patient monitoring: continuous pulse ox, blood pressure, heart rate and cardiac monitor Approach: midline Location: L3-L4 Injection technique: LOR air  Needle:  Needle type: Tuohy  Needle gauge: 17 G Needle length: 9 cm Needle insertion depth: 8 cm Catheter type: closed end flexible Catheter size: 19 Gauge Catheter at skin depth: 14 cm Test dose: negative  Assessment Sensory level: T8 Events: blood not aspirated, injection not painful, no injection resistance, no paresthesia and negative IV test  Additional Notes Patient identified. Risks/Benefits/Options discussed with patient including but not limited to bleeding, infection, nerve damage, paralysis, failed block, incomplete pain control, headache, blood pressure changes, nausea, vomiting, reactions to medication both or allergic, itching and postpartum back pain. Confirmed with bedside nurse the patient's most recent platelet count. Confirmed with patient that they are not currently taking any anticoagulation, have any bleeding history or any family history of bleeding disorders. Patient expressed understanding and wished to proceed. All questions were answered. Sterile technique was used throughout the entire procedure. Please see nursing notes for vital signs. Test dose was given through epidural catheter and negative prior to continuing to dose epidural or start infusion. Warning signs of high block given to the patient including shortness of breath, tingling/numbness in hands, complete motor  block, or any concerning symptoms with instructions to call for help. Patient was given instructions on fall risk and not to get out of bed. All questions and concerns addressed with instructions to call with any issues or inadequate analgesia.  Reason for block:procedure for pain

## 2022-07-21 ENCOUNTER — Encounter (HOSPITAL_COMMUNITY): Payer: Self-pay | Admitting: Obstetrics and Gynecology

## 2022-07-21 LAB — RPR: RPR Ser Ql: NONREACTIVE

## 2022-07-21 MED ORDER — DIBUCAINE (PERIANAL) 1 % EX OINT: 1.0000 | TOPICAL_OINTMENT | CUTANEOUS | Status: DC | PRN

## 2022-07-21 MED ORDER — ONDANSETRON HCL 4 MG/2ML IJ SOLN: 4.0000 mg | INTRAMUSCULAR | Status: DC | PRN

## 2022-07-21 MED ORDER — ACETAMINOPHEN 325 MG PO TABS: 650.0000 mg | ORAL_TABLET | ORAL | Status: DC | PRN

## 2022-07-21 MED ORDER — BISACODYL 10 MG RE SUPP: 10.0000 mg | Freq: Every day | RECTAL | Status: DC | PRN

## 2022-07-21 MED ORDER — SERTRALINE HCL 50 MG PO TABS
100.0000 mg | ORAL_TABLET | Freq: Every day | ORAL | Status: DC
  Administered 2022-07-21 – 2022-07-22 (×2): 100 mg via ORAL
  Filled 2022-07-21 (×2): qty 2

## 2022-07-21 MED ORDER — SIMETHICONE 80 MG PO CHEW: 80.0000 mg | CHEWABLE_TABLET | ORAL | Status: DC | PRN

## 2022-07-21 MED ORDER — MAGNESIUM SULFATE 40 GM/1000ML IV SOLN
2.0000 g/h | INTRAVENOUS | Status: AC
  Administered 2022-07-21: 2 g/h via INTRAVENOUS
  Filled 2022-07-21 (×2): qty 1000

## 2022-07-21 MED ORDER — COCONUT OIL OIL
1.0000 | TOPICAL_OIL | Status: DC | PRN
  Administered 2022-07-22: 1 via TOPICAL

## 2022-07-21 MED ORDER — LACTATED RINGERS IV SOLN: INTRAVENOUS | Status: DC

## 2022-07-21 MED ORDER — OXYCODONE HCL 5 MG PO TABS: 5.0000 mg | ORAL_TABLET | ORAL | Status: DC | PRN

## 2022-07-21 MED ORDER — IBUPROFEN 600 MG PO TABS
600.0000 mg | ORAL_TABLET | Freq: Four times a day (QID) | ORAL | Status: DC
  Administered 2022-07-21 – 2022-07-23 (×9): 600 mg via ORAL
  Filled 2022-07-21 (×9): qty 1

## 2022-07-21 MED ORDER — FLEET ENEMA 7-19 GM/118ML RE ENEM: 1.0000 | ENEMA | Freq: Every day | RECTAL | Status: DC | PRN

## 2022-07-21 MED ORDER — TETANUS-DIPHTH-ACELL PERTUSSIS 5-2.5-18.5 LF-MCG/0.5 IM SUSY: 0.5000 mL | PREFILLED_SYRINGE | Freq: Once | INTRAMUSCULAR | Status: DC

## 2022-07-21 MED ORDER — DIPHENHYDRAMINE HCL 25 MG PO CAPS: 25.0000 mg | ORAL_CAPSULE | Freq: Four times a day (QID) | ORAL | Status: DC | PRN

## 2022-07-21 MED ORDER — ONDANSETRON HCL 4 MG PO TABS: 4.0000 mg | ORAL_TABLET | ORAL | Status: DC | PRN

## 2022-07-21 MED ORDER — OXYCODONE HCL 5 MG PO TABS: 10.0000 mg | ORAL_TABLET | ORAL | Status: DC | PRN

## 2022-07-21 MED ORDER — WITCH HAZEL-GLYCERIN EX PADS: 1.0000 | MEDICATED_PAD | CUTANEOUS | Status: DC | PRN

## 2022-07-21 MED ORDER — PRENATAL MULTIVITAMIN CH
1.0000 | ORAL_TABLET | Freq: Every day | ORAL | Status: DC
  Administered 2022-07-21 – 2022-07-23 (×3): 1 via ORAL
  Filled 2022-07-21 (×3): qty 1

## 2022-07-21 MED ORDER — NIFEDIPINE ER OSMOTIC RELEASE 60 MG PO TB24
60.0000 mg | ORAL_TABLET | Freq: Every day | ORAL | Status: DC
  Administered 2022-07-21 – 2022-07-23 (×3): 60 mg via ORAL
  Filled 2022-07-21 (×3): qty 1

## 2022-07-21 MED ORDER — BENZOCAINE-MENTHOL 20-0.5 % EX AERO: 1.0000 | INHALATION_SPRAY | CUTANEOUS | Status: DC | PRN

## 2022-07-21 MED ORDER — DOCUSATE SODIUM 100 MG PO CAPS
100.0000 mg | ORAL_CAPSULE | Freq: Two times a day (BID) | ORAL | Status: DC
  Administered 2022-07-22 – 2022-07-23 (×3): 100 mg via ORAL
  Filled 2022-07-21 (×3): qty 1

## 2022-07-21 NOTE — Progress Notes (Signed)
Patient ID: Victoria French, female   DOB: September 09, 1986, 36 y.o.   MRN: [id number] Pt with good diuresis on magnesium  Afeb BP still elevated on magnesium, will start procardia XL 60mg  po q hs

## 2022-07-21 NOTE — Lactation Note (Signed)
This note was copied from a baby's chart. Lactation Consultation Note  Patient Name: Victoria French [id number] Date: 07/21/2022 Reason for consult: Initial assessment;Maternal endocrine disorder Age:34 hours  P3, Mother hopes to breastfeed this child longer than her first two.  Baby latched for 5 min and became sleepy but has breastfeed x 2 since birth.  Worked on positioning and depth. Mother was able to hand express drops. Feed on demand with cues.  Goal 8-12+ times per day after first 24 hrs.  Place baby STS if not cueing.  Mom made aware of O/P services, breastfeeding support groups,  and our phone # for post-discharge questions.  Suggest calling for help as needed.  Maternal Data Has patient been taught Hand Expression?: Yes Does the patient have breastfeeding experience prior to this delivery?: Yes How long did the patient breastfeed?: a few months, supplemented from the beg.  Feeding Mother's Current Feeding Choice: Breast Milk  LATCH Score Latch: Repeated attempts needed to sustain latch, nipple held in mouth throughout feeding, stimulation needed to elicit sucking reflex.  Audible Swallowing: A few with stimulation  Type of Nipple: Everted at rest and after stimulation  Comfort (Breast/Nipple): Soft / non-tender  Hold (Positioning): Assistance needed to correctly position infant at breast and maintain latch.  LATCH Score: 7   Lactation Tools Discussed/Used    Interventions Interventions: Breast feeding basics reviewed;Assisted with latch;Skin to skin;Hand express;Education;LC Services brochure  Discharge    Consult Status Consult Status: Follow-up Date: 07/22/22 Follow-up type: In-patient    Vivianne Master [facility] 07/21/2022, 11:29 AM

## 2022-07-21 NOTE — Progress Notes (Signed)
Patient ID: Victoria French, female   DOB: [birth year], 36 y.o.   MRN: [id number] Pt doing well s/p delivery on magnesium for severe range blood pressures, no HA.  Nursing at bedside  BP have been fairly stable on magnesium so will follow and add meds as needed  D/w pt that elective surgery for sterilization not advisable with her BP issues and being on magnesium.  She also has a body habitus which would not make a pp tubal logistically easy.  We discussed interval tubal as an option.   Will follow BP postpartum.

## 2022-07-21 NOTE — Progress Notes (Signed)
[name] was referred for history of anxiety. * Referral screened out by Clinical Social Worker because none of the following criteria appear to apply: ~ History of anxiety/depression during this pregnancy, or of post-partum depression following prior delivery. ~ Diagnosis of anxiety and/or depression within last 3 years OR * [name]'s symptoms currently being treated with medication and/or therapy. Per chart review, [name] is currently prescribed/taking sertraline 100mg  daily.   Please contact the Clinical Social Worker if needs arise, by [facility] request, or if [name] scores greater than 9/yes to question 10 on Edinburgh Postpartum Depression Screen.  Victoria French, [place] Worker [facility] Cell#: [phone]

## 2022-07-21 NOTE — Anesthesia Postprocedure Evaluation (Signed)
Anesthesia Post Note  Patient: Victoria French  Procedure(s) Performed: AN AD [place]     Patient location during evaluation: OB High Risk Anesthesia Type: Epidural Level of consciousness: awake, oriented and awake and alert Pain management: pain level controlled Vital Signs Assessment: post-procedure vital signs reviewed and stable Respiratory status: respiratory function stable, spontaneous breathing and nonlabored ventilation Cardiovascular status: stable Postop Assessment: no headache, adequate PO intake, able to ambulate, patient able to bend at knees and no apparent nausea or vomiting Anesthetic complications: no   No notable events documented.  Last Vitals:  Vitals:   07/21/22 1430 07/21/22 1539  BP:    Pulse:    Resp: 18 16  Temp:    SpO2:      Last Pain:  Vitals:   07/21/22 1146  TempSrc: Oral  PainSc:    Pain Goal:                   Victoria French

## 2022-07-22 LAB — CBC
HCT: 33.1 % — ABNORMAL LOW (ref 36.0–46.0)
Hemoglobin: 11 g/dL — ABNORMAL LOW (ref 12.0–15.0)
MCH: 29.3 pg (ref 26.0–34.0)
MCHC: 33.2 g/dL (ref 30.0–36.0)
MCV: 88 fL (ref 80.0–100.0)
Platelets: 324 10*3/uL (ref 150–400)
RBC: 3.76 MIL/uL — ABNORMAL LOW (ref 3.87–5.11)
RDW: 14.9 % (ref 11.5–15.5)
WBC: 11.3 10*3/uL — ABNORMAL HIGH (ref 4.0–10.5)
nRBC: 0 % (ref 0.0–0.2)

## 2022-07-22 NOTE — Progress Notes (Signed)
Post Partum Day 1 Subjective: no complaints, up ad lib, voiding, and tolerating PO  Objective: Blood pressure 137/79, pulse 85, temperature 98 F (36.7 C), temperature source Axillary, resp. rate 18, SpO2 97 %, unknown if currently breastfeeding.  Physical Exam:  General: alert, cooperative, and appears stated age 36: appropriate Uterine Fundus: firm DVT Evaluation: No evidence of DVT seen on physical exam.  Recent Labs    07/20/22 2129 07/22/22 0531  HGB 11.4* 11.0*  HCT 33.9* 33.1*    Assessment/Plan: Plan for discharge tomorrow S/P 24 hrs magnesium for seizure prophylaxis On Procardia 60mg  XL, BPs currently normotensive   LOS: 2 days   Vanessa Kick, MD 07/22/2022, 5:07 PM

## 2022-07-22 NOTE — Anesthesia Postprocedure Evaluation (Signed)
Anesthesia Post Note  Patient: Victoria French  Procedure(s) Performed: AN AD [place]     Patient location during evaluation: Mother Baby Anesthesia Type: Epidural Level of consciousness: awake and alert Pain management: pain level controlled Vital Signs Assessment: post-procedure vital signs reviewed and stable Respiratory status: spontaneous breathing, nonlabored ventilation and respiratory function stable Cardiovascular status: stable Postop Assessment: no headache, no backache and epidural receding Anesthetic complications: no   No notable events documented.  Last Vitals:  Vitals:   07/22/22 0455 07/22/22 0807  BP: 114/64 127/76  Pulse: (!) 112 (!) 102  Resp: 18 18  Temp: 36.6 C 36.6 C  SpO2: 100% 99%    Last Pain:  Vitals:   07/22/22 0807  TempSrc: Oral  PainSc:    Pain Goal:                   Rayvon Char

## 2022-07-23 MED ORDER — IBUPROFEN 600 MG PO TABS: 600.0000 mg | ORAL_TABLET | Freq: Four times a day (QID) | ORAL | 0 refills | Status: AC | PRN

## 2022-07-23 MED ORDER — MEASLES, MUMPS & RUBELLA VAC IJ SOLR
0.5000 mL | Freq: Once | INTRAMUSCULAR | Status: AC
  Administered 2022-07-23: 0.5 mL via SUBCUTANEOUS
  Filled 2022-07-23: qty 0.5

## 2022-07-23 MED ORDER — NIFEDIPINE ER 60 MG PO TB24: 60.0000 mg | ORAL_TABLET | Freq: Every day | ORAL | 1 refills | Status: AC

## 2022-07-23 NOTE — Lactation Note (Signed)
This note was copied from a baby's chart. Lactation Consultation Note  Patient Name: Victoria French [id number] Date: 07/23/2022 Reason for consult: Follow-up assessment;Term;Maternal endocrine disorder Age:34 hours  LC in to visit with P3 Mom of term baby on day of discharge.  Baby is at a 4% weight loss.  Mom has been supplementing baby after breast attempts.  Both nipples have positional stripes and bruising.  Baby gags when nipple is deep into mouth per Mom.   RN set up DEBP and encouraged pumping.  Reviewed pumping and frequency.   Encouraged placing baby STS and offering the breast after formula supplementation.  Paced bottle feeding recommended and reviewed this with Mom and FOB.  Baby currently sleeping after supplementation.  Offer to assist with latch prior to discharge prn.  Mom to ask for help.  Engorgement prevention and treatment reviewed.  Mom is interested in OP assistance. Encouraged Mom to call prn.  OP message sent to [facility] OP clinic.    Lactation Tools Discussed/Used Tools: Pump;Flanges;Bottle Breast pump type: Double-Electric Breast Pump;Manual Pump Education: Setup, frequency, and cleaning;Milk Storage (Reviewed) Reason for Pumping: Support milk supply/difficult latch/hypothyroidism/PCOS Pumping frequency: Q 3 hrs  Interventions Interventions: Breast feeding basics reviewed;Skin to skin;Breast massage;Hand express;DEBP;Hand pump;Education;Pace feeding  Discharge Discharge Education: Engorgement and breast care;Warning signs for feeding baby;Outpatient recommendation;Outpatient Epic message sent Pump: Personal;DEBP (Spectra DEBP)  Consult Status Consult Status: Complete Date: 07/23/22 Follow-up type: [place] 07/23/2022, 8:30 AM

## 2022-07-23 NOTE — Progress Notes (Signed)
Patient's BPs this morning higher than they have been in last 24 hours.    8:49am - BP 147/73   9:30am - Daily nifedipine 60mg  PO given 11:00am - BP was 145/80.    Discharge instructions include continuing nifedipine daily and BP check in office in one week.  Phoned Dr. Harrington Challenger with above information.  She said we may proceed with discharge as ordered.

## 2022-07-23 NOTE — Discharge Summary (Signed)
Postpartum Discharge Summary       Patient Name: Victoria French DOB: 26-Sep-1986 MRN: [id number]  Date of admission: 07/20/2022 Delivery date:07/21/2022  Delivering provider: Irene Pap E  Date of discharge: 07/23/2022  Admitting diagnosis: Gestational hypertension [O13.9] Intrauterine pregnancy: [redacted]w[redacted]d     Secondary diagnosis:  Principal Problem:   Gestational hypertension  Additional problems: Hypothyroidism, depression     Discharge diagnosis: Term Pregnancy Delivered and Preeclampsia (mild)                                              Post partum procedures: NA Augmentation: AROM and Pitocin Complications: None  Hospital course: Induction of Labor With Vaginal Delivery   36 y.o. yo G3P3003 at [redacted]w[redacted]d was admitted to the hospital 07/20/2022 for induction of labor.  Indication for induction: Preeclampsia.  Patient had an labor course complicated bynothing Membrane Rupture Time/Date: 11:28 PM ,07/20/2022   Delivery Method:Vaginal, Spontaneous  Episiotomy: None  Lacerations:  None  Details of delivery can be found in separate delivery note.  Patient had a postpartum course complicated bynothing. Patient is discharged home 07/23/22.  Newborn Data: Birth date:07/21/2022  Birth time:5:01 AM  Gender:Female  Living status:Living  Apgars:9 ,9  Weight:3170 g   Magnesium Sulfate received: Yes: Seizure prophylaxis BMZ received: No Rhophylac:N/A  Physical exam  Vitals:   07/22/22 1550 07/22/22 2009 07/22/22 2314 07/23/22 0400  BP: 137/79 137/82 111/62 (!) 105/56  Pulse: 85 97 76 78  Resp:  17 18 18   Temp:  98.3 F (36.8 C) 98.5 F (36.9 C) 98.4 F (36.9 C)  TempSrc:  Oral Oral Oral  SpO2: 97% 98% 97% 99%   General: alert, cooperative, and no distress Lochia: appropriate Uterine Fundus: firm DVT Evaluation: No evidence of DVT seen on physical exam. Labs: Lab Results  Component Value Date   WBC 11.3 (H) 07/22/2022   HGB 11.0 (L) 07/22/2022    HCT 33.1 (L) 07/22/2022   MCV 88.0 07/22/2022   PLT 324 07/22/2022      Latest Ref Rng & Units 07/20/2022   12:13 PM  CMP  Glucose 70 - 99 mg/dL 107   BUN 6 - 20 mg/dL 7   Creatinine 0.44 - 1.00 mg/dL 0.64   Sodium 135 - 145 mmol/L 137   Potassium 3.5 - 5.1 mmol/L 4.0   Chloride 98 - 111 mmol/L 106   CO2 22 - 32 mmol/L 21   Calcium 8.9 - 10.3 mg/dL 9.1   Total Protein 6.5 - 8.1 g/dL 5.8   Total Bilirubin 0.3 - 1.2 mg/dL 0.3   Alkaline Phos 38 - 126 U/L 154   AST 15 - 41 U/L 17   ALT 0 - 44 U/L 12    Edinburgh Score:    07/22/2022   10:00 AM  Edinburgh Postnatal Depression Scale Screening Tool  I have been able to laugh and see the funny side of things. 0  I have looked forward with enjoyment to things. 0  I have blamed myself unnecessarily when things went wrong. 0  I have been anxious or worried for no good reason. 1  I have felt scared or panicky for no good reason. 1  Things have been getting on top of me. 0  I have been so unhappy that I have had difficulty sleeping. 0  I have felt sad or  miserable. 1  I have been so unhappy that I have been crying. 0  The thought of harming myself has occurred to me. 0  Edinburgh Postnatal Depression Scale Total 3      After visit meds:  Allergies as of 07/23/2022       Reactions   Tegretol [carbamazepine] Hives        Medication List     STOP taking these medications    calcium carbonate 500 MG chewable tablet Commonly known as: TUMS - dosed in mg elemental calcium   famotidine 20 MG tablet Commonly known as: PEPCID   prenatal multivitamin Tabs tablet       TAKE these medications    ibuprofen 600 MG tablet Commonly known as: ADVIL Take 1 tablet (600 mg total) by mouth every 6 (six) hours as needed.   levothyroxine 25 MCG tablet Commonly known as: SYNTHROID Take 25 mcg by mouth daily before breakfast.   NIFEdipine 60 MG 24 hr tablet Commonly known as: ADALAT CC Take 1 tablet (60 mg total) by mouth  daily.   sertraline 50 MG tablet Commonly known as: ZOLOFT Take 100 mg by mouth at bedtime.               Discharge Care Instructions  (From admission, onward)           Start     Ordered   07/23/22 0000  Discharge wound care:       Comments: For a cesarean delivery: You may wash incision with soap and water.  Do not soak or submerge the incision for 2 weeks. Keep incision dry. You may need to keep a sanitary pad or panty liner between the incision and your clothing for comfort and to keep the incision dry. If you note drainage, increased pain, or increased redness of the incision, then please notify your physician.   07/23/22 0744   07/23/22 0000  If the dressing is still on your incision site when you go home, remove it on the third day after your surgery date. Remove dressing if it begins to fall off, or if it is dirty or damaged before the third day.       Comments: For a cesarean delivery   07/23/22 0744             Discharge home in stable condition Infant Feeding: Breast Infant Disposition:home with mother Discharge instruction: per After Visit Summary and Postpartum booklet. Activity: Advance as tolerated. Pelvic rest for 6 weeks.  Diet: routine diet Anticipated Birth Control: Unsure Postpartum Appointment:2-3 days Future Appointments:No future appointments. Follow up Visit:  [place] Follow up in 1 week(s).   Why: For a BP check Contact information: [address] [place] [address] [phone]                     07/23/2022 Vanessa Kick, MD

## 2022-08-03 ENCOUNTER — Telehealth (HOSPITAL_COMMUNITY): Payer: Self-pay

## 2022-08-03 NOTE — Telephone Encounter (Signed)
Patient did not answer phone call. Voicemail left for patient.   Victoria French [facility] 08/03/2022,1211

## 2022-10-07 NOTE — H&P (Signed)
Victoria French is an 37 y.o. [phone] female here for scheduled surgery for sterilization/. Pt recently delivered her last child in 07/2022. She is certain she has completed childbearing and desires permanent sterilization   Pertinent Gynecological History: Menses: flow is moderate Bleeding: regular monthly  Contraception: none DES exposure: denies Blood transfusions: none Sexually transmitted diseases: no past history Previous GYN Procedures:  laser surgery of the cervix   Last mammogram:  n/a  Date:   Last pap: normal Date: 09/2022 OB History: G3, P3003   Menstrual History: Menarche age: 42 No LMP recorded.    Past Medical History:  Diagnosis Date   Anxiety    Hypothyroidism    PCOS (polycystic ovarian syndrome)    SVD (spontaneous vaginal delivery) 11/29/2018    Past Surgical History:  Procedure Laterality Date   LASER ABLATION OF THE CERVIX     WISDOM TOOTH EXTRACTION      Family History  Problem Relation Age of Onset   Hypertension Mother    Miscarriages / [place] Mother    Heart disease Maternal Grandmother    Cancer Maternal Grandfather     Social History:  reports that she has quit smoking. Her smoking use included cigarettes. She has never used smokeless tobacco. She reports that she does not drink alcohol and does not use drugs.  Allergies:  Allergies  Allergen Reactions   Tegretol [Carbamazepine] Hives    No medications prior to admission.    Review of Systems  Constitutional:  Negative for activity change and fatigue.  Eyes:  Negative for visual disturbance.  Respiratory:  Negative for chest tightness and shortness of breath.   Cardiovascular:  Negative for chest pain, palpitations and leg swelling.  Gastrointestinal:  Negative for abdominal pain.  Genitourinary:  Negative for menstrual problem and pelvic pain.  Musculoskeletal:  Negative for back pain.  Neurological:  Negative for headaches.  Psychiatric/Behavioral:  The patient  is not nervous/anxious.     unknown if currently breastfeeding. Physical Exam Vitals and nursing note reviewed. Exam conducted with a chaperone present.  Constitutional:      Appearance: Normal appearance. She is obese.  Cardiovascular:     Rate and Rhythm: Normal rate.     Pulses: Normal pulses.  Pulmonary:     Effort: Pulmonary effort is normal.  Abdominal:     Palpations: Abdomen is soft.     Comments: Abdominal fat  Musculoskeletal:     Cervical back: Normal range of motion.  Skin:    General: Skin is warm.  Neurological:     General: No focal deficit present.     Mental Status: She is alert and oriented to person, place, and time. Mental status is at baseline.  Psychiatric:        Mood and Affect: Mood normal.        Behavior: Behavior normal.        Thought Content: Thought content normal.        Judgment: Judgment normal.     No results found for this or any previous visit (from the past 24 hour(s)).  No results found.  Assessment/Plan: - Admit  - ERAS protocol  - Verify consent for sterilization - UPT prior to surgery - To OR when ready   Victoria French 10/07/2022, 1:54 AM

## 2022-10-12 ENCOUNTER — Ambulatory Visit (HOSPITAL_BASED_OUTPATIENT_CLINIC_OR_DEPARTMENT_OTHER)
Admission: RE | Admit: 2022-10-12 | Payer: BC Managed Care – PPO | Source: Home / Self Care | Admitting: Obstetrics and Gynecology

## 2022-10-12 ENCOUNTER — Encounter (HOSPITAL_BASED_OUTPATIENT_CLINIC_OR_DEPARTMENT_OTHER): Admission: RE | Payer: Self-pay | Source: Home / Self Care

## 2022-10-12 DIAGNOSIS — Z302 Encounter for sterilization: Secondary | ICD-10-CM

## 2022-10-12 SURGERY — SALPINGECTOMY, BILATERAL, LAPAROSCOPIC
Anesthesia: General | Laterality: Bilateral
